# Patient Record
Sex: Female | Born: 1935 | Race: White | Hispanic: No | Smoking: Never smoker
Health system: Southern US, Community
[De-identification: ages and names within clinical notes are randomized; demographics above are authoritative.]

## PROBLEM LIST (undated history)

## (undated) DIAGNOSIS — J349 Unspecified disorder of nose and nasal sinuses: Secondary | ICD-10-CM

## (undated) DIAGNOSIS — I10 Essential (primary) hypertension: Secondary | ICD-10-CM

## (undated) DIAGNOSIS — E785 Hyperlipidemia, unspecified: Secondary | ICD-10-CM

## (undated) DIAGNOSIS — K219 Gastro-esophageal reflux disease without esophagitis: Secondary | ICD-10-CM

## (undated) HISTORY — DX: Essential (primary) hypertension: I10

## (undated) HISTORY — PX: TUBAL LIGATION: SHX77

## (undated) HISTORY — DX: Hyperlipidemia, unspecified: E78.5

## (undated) HISTORY — DX: Unspecified disorder of nose and nasal sinuses: J34.9

## (undated) HISTORY — DX: Gastro-esophageal reflux disease without esophagitis: K21.9

## (undated) HISTORY — PX: ABDOMINAL HYSTERECTOMY: SUR658

## (undated) HISTORY — PX: APPENDECTOMY: SHX54

---

## 1998-12-18 ENCOUNTER — Ambulatory Visit (HOSPITAL_COMMUNITY): Admission: RE | Admit: 1998-12-18 | Discharge: 1998-12-18 | Payer: Self-pay | Admitting: *Deleted

## 1999-02-02 ENCOUNTER — Other Ambulatory Visit: Admission: RE | Admit: 1999-02-02 | Discharge: 1999-02-02 | Payer: Self-pay | Admitting: Obstetrics and Gynecology

## 1999-05-27 ENCOUNTER — Encounter: Admission: RE | Admit: 1999-05-27 | Discharge: 1999-05-27 | Payer: Self-pay | Admitting: Family Medicine

## 2000-01-18 ENCOUNTER — Other Ambulatory Visit: Admission: RE | Admit: 2000-01-18 | Discharge: 2000-01-18 | Payer: Self-pay | Admitting: Obstetrics and Gynecology

## 2001-01-17 ENCOUNTER — Other Ambulatory Visit: Admission: RE | Admit: 2001-01-17 | Discharge: 2001-01-17 | Payer: Self-pay | Admitting: Obstetrics and Gynecology

## 2005-11-03 ENCOUNTER — Encounter: Admission: RE | Admit: 2005-11-03 | Discharge: 2005-11-03 | Payer: Self-pay | Admitting: Family Medicine

## 2007-01-19 ENCOUNTER — Encounter: Admission: RE | Admit: 2007-01-19 | Discharge: 2007-01-19 | Payer: Self-pay | Admitting: Gastroenterology

## 2007-09-25 ENCOUNTER — Encounter: Admission: RE | Admit: 2007-09-25 | Discharge: 2007-09-25 | Payer: Self-pay | Admitting: Orthopedic Surgery

## 2007-09-27 ENCOUNTER — Ambulatory Visit (HOSPITAL_BASED_OUTPATIENT_CLINIC_OR_DEPARTMENT_OTHER): Admission: RE | Admit: 2007-09-27 | Discharge: 2007-09-27 | Payer: Self-pay | Admitting: Orthopedic Surgery

## 2010-12-28 NOTE — Op Note (Signed)
NAMEBailley, Kristi Huerta                ACCOUNT NO.:  [id number]   MEDICAL RECORD NO.:  [id number]          PATIENT TYPE:  AMB   LOCATION:  DSC                          FACILITY:  [facility]   PHYSICIAN:  Katy Fitch. Sypher, M.D. 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   POSTOPERATIVE DIAGNOSIS:  Mucoid cyst, right ring finger ulnar nail  fold, status post failed debridement by dermatologist, with 4+  degenerative arthritis of distal interphalangeal joint.   OPERATION:  Distal interphalangeal joint debridement with removal of  marginal osteophytes and irrigation of cartilaginous debris and  resection of mucoid cyst, right ring finger.   OPERATING SURGEON:  Katy Fitch. Sypher, M.D.   ASSISTANT:  Jonni Sanger, P.A.   ANESTHESIA:  2% lidocaine metacarpal head-level block, right ring  finger, supplemented by IV sedation, supervising anesthesiologist  Maren Beach, M.D.   INDICATIONS:  Kristi Huerta is a 77 year old woman referred for evaluation  and management of a recurrent mucoid cyst from the dorsal ulnar aspect  of her right ring finger.  She had seen her dermatologist and had a  debridement of the finger approximately 6 months prior.  She had  immediate recurrence.  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   Due to a failure to respond to nonoperative measures and due to the fact  that her cyst was thinning the skin to the point of rupture, she  requested debridement.  Preoperatively she was advised that we could not  affect the natural history of her osteoarthritis and could not guarantee  that she would not develop further mucoid cysts in this finger or  adjacent fingers.   Despite this, in Kristi effort to prevent rupture and  possible septic arthritis of the DIP joint, she is brought to the  operating room at this time for debridement and cyst excision.   PROCEDURE:  Kristi Huerta was brought to the operating room and placed in  supine position on the operating table.   Following light sedation, the right arm was prepped with Betadine soap  and solution and sterilely draped.  Lidocaine 2% was infiltrated at the  metacarpal head level to obtain the initial block.  Following  exsanguination of the right ring finger with a gauze wrap, a strip of  Esmarch bandage was used as a digital tourniquet over the proximal  phalangeal segment.  The procedure commenced with longitudinal incisions  paralleling the extensor tendon on its dorsal ulnar and dorsal radial  aspect.  The capsule of the DIP joint was entered on the dorsal radial  and dorsal ulnar aspects, sparing the collateral ligaments.  Marginal  osteophytes were removed from the base of the distal phalanx and  synovium removed over the dorsal aspect of the middle phalangeal head.  Extensive mucinous material was removed from the DIP joint, followed by  thorough irrigation with sterile saline and a blunt dental needle.  The  mucoid cyst was then debrided with removal of the pseudomembrane.   The wound was then repaired with interrupted sutures of 5-0 nylon.   For aftercare Kristi Huerta was provided a prescription for Vicodin 5 mg  one p.o. q.4-6 h. p.r.n. pain, 20 tablets without refill.  Also Keflex  500 mg one p.o. q.8 h. x4 days as a prophylactic antibiotic.      Katy Fitch Sypher, M.D.  Electronically Signed     RVS/MEDQ  D:  09/27/2007  T:  09/28/2007  Job:  [id number]

## 2011-05-06 LAB — HEPATIC FUNCTION PANEL
ALT: 24
Bilirubin, Direct: 0.1
Indirect Bilirubin: 0.4
Total Protein: 6.4

## 2011-05-06 LAB — BASIC METABOLIC PANEL
BUN: 12
Calcium: 9.6
Chloride: 104
GFR calc non Af Amer: >60
Glucose, Bld: 97

## 2011-05-06 LAB — POCT HEMOGLOBIN-HEMACUE: Hemoglobin: 14.1

## 2012-10-01 ENCOUNTER — Ambulatory Visit (INDEPENDENT_AMBULATORY_CARE_PROVIDER_SITE_OTHER): Payer: Medicare Other | Admitting: Internal Medicine

## 2012-10-01 ENCOUNTER — Encounter: Payer: Self-pay | Admitting: Internal Medicine

## 2012-10-01 VITALS — BP 120/68 | HR 65 | Temp 98.4°F | Ht 65.0 in | Wt 129.6 lb

## 2012-10-01 DIAGNOSIS — R05 Cough: Secondary | ICD-10-CM | POA: Insufficient documentation

## 2012-10-01 DIAGNOSIS — R058 Other specified cough: Secondary | ICD-10-CM | POA: Insufficient documentation

## 2012-10-01 MED ORDER — PREDNISONE (PAK) 10 MG PO TABS: ORAL_TABLET | ORAL | Status: DC

## 2012-10-01 MED ORDER — FAMOTIDINE 20 MG PO TABS: ORAL_TABLET | ORAL | Status: DC

## 2012-10-01 MED ORDER — TRAMADOL HCL 50 MG PO TABS: ORAL_TABLET | ORAL | Status: DC

## 2012-10-01 MED ORDER — PANTOPRAZOLE SODIUM 40 MG PO TBEC: 40.0000 mg | DELAYED_RELEASE_TABLET | Freq: Every day | ORAL | Status: DC

## 2012-10-01 NOTE — Assessment & Plan Note (Addendum)
The most common causes of chronic cough in immunocompetent adults include the following: upper airway cough syndrome (UACS), previously referred to as postnasal drip syndrome (PNDS), which is caused by variety of rhinosinus conditions; (2) asthma; (3) GERD; (4) chronic bronchitis from cigarette smoking or other inhaled environmental irritants; (5) nonasthmatic eosinophilic bronchitis; and (6) bronchiectasis.   These conditions, singly or in combination, have accounted for up to 94% of the causes of chronic cough in prospective studies.   Other conditions have constituted no >6% of the causes in prospective studies These have included bronchogenic carcinoma, chronic interstitial pneumonia, sarcoidosis, left ventricular failure, ACEI-induced cough, and aspiration from a condition associated with pharyngeal dysfunction.    Chronic cough is often simultaneously caused by more than one condition. A single cause has been found from 38 to 82% of the time, multiple causes from 18 to 62%. Multiply caused cough has been the result of three diseases up to 42% of the time.       Most likely this is  Classic Upper airway cough syndrome, so named because it's frequently impossible to sort out how much is  CR/sinusitis with freq throat clearing (which can be related to primary GERD)   vs  causing  secondary (" extra esophageal")  GERD from wide swings in gastric pressure that occur with throat clearing, often  promoting self use of mint and menthol lozenges that reduce the lower esophageal sphincter tone and exacerbate the problem further in a cyclical fashion.   These are the same pts (now being labeled as having "irritable larynx syndrome" by some cough centers) who not infrequently have a history of having failed to tolerate ace inhibitors,  dry powder inhalers or biphosphonates or report having atypical reflux symptoms that don't respond to standard doses of PPI , and are easily confused as having aecopd or asthma  flares by even experienced allergists/ pulmonologists.   For now max rx directed at gerd/cyclical cough then regroup  See instructions for specific recommendations which were reviewed directly with the patient who was given a copy with highlighter outlining the key components.

## 2012-10-01 NOTE — Progress Notes (Signed)
Subjective:    Patient ID: Kristi Huerta, female    DOB: [birth year]  MRN: [id number]  HPI  8 yowf never smoker with h/o allergies s asthma in 20's but outgrew it in her 59's symptoms in spring never cough or wheeze then onset cough April 2013 referred to pulmonary clinic 10/01/2012 by Dr Philipp Deputy   10/01/2012 1st pulmonary eval cc daily cough abupt onset April 2013 and persistent since [facility] with worse nasal symptoms intermittently but always the sensation of draingage even when no active nasal symptoms> never produces any mucus. Treated with clariton,  mucinex dm,  omeprazole, no change. Only better with hydrocodan then sleeps all night then recurs p stirring around for an hour daily.  No obvious daytime variabilty or assoc sob cp or chest tightness, subjective wheeze overt s  hb symptoms. No unusual exp hx    Sleeping ok without nocturnal  or early am exacerbation  of respiratory  c/o's or need for noct saba. Also denies any obvious fluctuation of symptoms with weather or environmental changes or other aggravating or alleviating factors except as outlined above      Kouffman Reflux v Neurogenic Cough Differentiator Reflux Comments  Do you awaken from a sound sleep coughing violently?                            With trouble breathing? no   Do you have choking episodes when you cannot  Get enough air, gasping for air ?              rarely   Do you usually cough when you lie down into  The bed, or when you just lie down to rest ?                          Yes   Do you usually cough after meals or eating?         Yes   Do you cough when (or after) you bend over?    no   GERD SCORE     Kouffman Reflux v Neurogenic Cough Differentiator Neurogenic   Do you more-or-less cough all day long? yes   Does change of temperature make you cough? no   Does laughing or chuckling cause you to cough? sometimes   Do fumes (perfume, automobile fumes, burned  Toast, etc.,) cause you to cough  ?      no   Does speaking, singing, or talking on the phone cause you to cough   ?               Sometimes    Neurogenic/Airway score       Review of Systems  Constitutional: Positive for unexpected weight change. Negative for fever.  HENT: Positive for sneezing. Negative for ear pain, nosebleeds, congestion, sore throat, rhinorrhea, trouble swallowing, dental problem, postnasal drip and sinus pressure.   Eyes: Negative for redness and itching.  Respiratory: Positive for cough. Negative for chest tightness, shortness of breath and wheezing.   Cardiovascular: Negative for palpitations and leg swelling.  Gastrointestinal: Negative for nausea and vomiting.  Genitourinary: Negative for dysuria.  Musculoskeletal: Negative for joint swelling.  Skin: Negative for rash.  Neurological: Negative for headaches.  Hematological: Does not bruise/bleed easily.  Psychiatric/Behavioral: Positive for dysphoric mood. The patient is not nervous/anxious.        Objective:   Physical Exam  Pleasant thin amb wf nad with frequent throat clearing Wt Readings from Last 3 Encounters:  10/01/12 129 lb 9.6 oz (58.786 kg)     HEENT: nl dentition, turbinates, and orophanx. Nl external ear canals without cough reflex   NECK :  without JVD/Nodes/TM/ nl carotid upstrokes bilaterally   LUNGS: no acc muscle use, clear to A and P bilaterally without cough on insp or exp maneuvers   CV:  RRR  no s3 or murmur or increase in P2, no edema   ABD:  soft and nontender with nl excursion in the supine position. No bruits or organomegaly, bowel sounds nl  MS:  warm without deformities, calf tenderness, cyanosis or clubbing  SKIN: warm and dry without lesions    NEURO:  alert, approp, no deficits        Assessment & Plan:

## 2012-10-01 NOTE — Patient Instructions (Signed)
The key to effective treatment for your cough is eliminating the non-stop cycle of cough you're stuck in long enough to let your airway heal completely and then see if there is anything still making you cough once you stop the cough suppression, but this should take no more than 5 days to figuure out  First take delsym two tsp every 12 hours and supplement if needed with  tramadol 50 mg up to 2 every 4 hours to suppress the urge to cough. Swallowing water or using ice chips/non mint and menthol containing candies (such as lifesavers or sugarless jolly ranchers) are also effective.  You should rest your voice and avoid activities that you know make you cough.  Once you have eliminated the cough for 3 straight days try reducing the tramadol first,  then the delsym as tolerated.    Try protonix 40 mg     Take 30-60 min before first meal of the day and Pepcid 20 mg one bedtime until  Return  GERD (REFLUX)  is an extremely common cause of respiratory symptoms, many times with no significant heartburn at all.    It can be treated with medication, but also with lifestyle changes including avoidance of late meals, excessive alcohol, smoking cessation, and avoid fatty foods, chocolate, peppermint, colas, red wine, and acidic juices such as orange juice.  NO MINT OR MENTHOL PRODUCTS SO NO COUGH DROPS  USE SUGARLESS CANDY INSTEAD (jolley ranchers or Stover's)  NO OIL BASED VITAMINS - use powdered substitutes.    Prednisone 10 mg take  4 each am x 2 days,   2 each am x 2 days,  1 each am x2days and stop    Please schedule a follow up office visit in 4 weeks, sooner if needed

## 2012-10-29 ENCOUNTER — Ambulatory Visit (INDEPENDENT_AMBULATORY_CARE_PROVIDER_SITE_OTHER): Payer: Medicare Other | Admitting: Internal Medicine

## 2012-10-29 ENCOUNTER — Other Ambulatory Visit (INDEPENDENT_AMBULATORY_CARE_PROVIDER_SITE_OTHER): Payer: Medicare Other

## 2012-10-29 ENCOUNTER — Encounter: Payer: Self-pay | Admitting: Internal Medicine

## 2012-10-29 VITALS — BP 140/82 | HR 63 | Temp 97.8°F | Ht 65.0 in | Wt 130.0 lb

## 2012-10-29 DIAGNOSIS — R05 Cough: Secondary | ICD-10-CM

## 2012-10-29 DIAGNOSIS — R059 Cough, unspecified: Secondary | ICD-10-CM

## 2012-10-29 LAB — CBC WITH DIFFERENTIAL/PLATELET
Basophils Absolute: 0 10*3/uL (ref 0.0–0.1)
Lymphocytes Relative: 25.6 % (ref 12.0–46.0)
Lymphs Abs: 1.1 10*3/uL (ref 0.7–4.0)
Monocytes Absolute: 0.5 10*3/uL (ref 0.1–1.0)
Monocytes Relative: 11 % (ref 3.0–12.0)
Neutro Abs: 2.6 10*3/uL (ref 1.4–7.7)
Neutrophils Relative %: 59.2 % (ref 43.0–77.0)
Platelets: 237 10*3/uL (ref 150.0–400.0)
RBC: 4.07 Mil/uL (ref 3.87–5.11)
RDW: 13 % (ref 11.5–14.6)
WBC: 4.3 10*3/uL — ABNORMAL LOW (ref 4.5–10.5)

## 2012-10-29 LAB — ALLERGY PROFILE REGION II-DC, DE, MD, ~~LOC~~, VA
Aspergillus fumigatus, m3: <0.1 kU/L
Box Elder IgE: <0.1 kU/L
Cat Dander: <0.1 kU/L
Common Ragweed: 0.21 kU/L — ABNORMAL HIGH
Dog Dander: <0.1 kU/L
Lamb's Quarters: 0.11 kU/L — ABNORMAL HIGH
Meadow Grass: 0.95 kU/L — ABNORMAL HIGH
Oak: <0.1 kU/L

## 2012-10-29 MED ORDER — TRAMADOL HCL 50 MG PO TABS: ORAL_TABLET | ORAL | Status: DC

## 2012-10-29 MED ORDER — PREDNISONE (PAK) 10 MG PO TABS: ORAL_TABLET | ORAL | Status: DC

## 2012-10-29 NOTE — Patient Instructions (Addendum)
Prednisone 10 mg take  4 each am x 2 days,   2 each am x 2 days,  1 each am x2days and stop   Take delsym two tsp every 12 hours and supplement if needed with  tramadol 50 mg up to 2 every 4 hours to suppress the urge to cough or even to clear your throat. Swallowing water or using ice chips/non mint and menthol containing candies (such as lifesavers or sugarless jolly ranchers) are also effective.  You should rest your voice and avoid activities that you know make you cough.  Once you have eliminated the cough for 3 straight days try reducing the tramadol first,  then the delsym as tolerated.    Chlortrimeton 4 mg at bedtime until return  Please see patient coordinator before you leave today  to schedule sinus ct  Please remember to go to the lab  department downstairs for your tests - we will call you with the results when they are available.  Please schedule a follow up office visit in 4 weeks, sooner if needed

## 2012-10-29 NOTE — Assessment & Plan Note (Signed)
-   sinus CT 10/29/2012  >> - Allergy Profile 10/29/2012 >>  ? Prednisone response is intriguing and favors cough variant asthma or eos rhinitis/ bronchitis but still strongly feel this is  Classic Upper airway cough syndrome, so named because it's frequently impossible to sort out how much is  CR/sinusitis with freq throat clearing (which can be related to primary GERD)   vs  causing  secondary (" extra esophageal")  GERD from wide swings in gastric pressure that occur with throat clearing, often  promoting self use of mint and menthol lozenges that reduce the lower esophageal sphincter tone and exacerbate the problem further in a cyclical fashion.   These are the same pts (now being labeled as having "irritable larynx syndrome" by some cough centers) who not infrequently have a history of having failed to tolerate ace inhibitors,  dry powder inhalers or biphosphonates or report having atypical reflux symptoms that don't respond to standard doses of PPI , and are easily confused as having aecopd or asthma flares by even experienced allergists/ pulmonologists.  Add 1st get h1 at hs  Explained to pt  The standardized cough guidelines published in Chest by [place] in 2006 are still the best available and consist of a multiple step process (up to 12!) , not a single office visit,  and are intended  to address this problem logically,  with an alogrithm dependent on response to empiric treatment at  each progressive step  to determine a specific diagnosis with  minimal addtional testing needed. Therefore if adherence is an issue or can't be accurately verified,  it's very unlikely the standard evaluation and treatment will be successful here.    Furthermore, response to therapy (other than acute cough suppression, which should only be used short term with avoidance of narcotic containing cough syrups if possible), can be a gradual process for which the patient may perceive immediate benefit.  Unlike  going to an eye doctor where the best perscription is almost always the first one and is immediately effective, this is almost never the case in the management of chronic cough syndromes. Therefore the patient needs to commit up front to consistently adhere to recommendations  for up to 6 weeks of therapy directed at the likely underlying problem(s) before the response can be reasonably evaluated.

## 2012-10-29 NOTE — Progress Notes (Signed)
Subjective:    Patient ID: Kristi Huerta, female    DOB: 17-Sep-1933  MRN: [id number]  HPI  65 yowf never smoker with h/o allergies s asthma in 20's but outgrew it in her 23's symptoms in spring never cough or wheeze then onset cough April 2013 referred to pulmonary clinic 10/01/2012 by Dr Philipp Deputy   10/01/2012 1st pulmonary eval cc daily cough abupt onset April 2013 and persistent since [facility] with worse nasal symptoms intermittently but always the sensation of draingage even when no active nasal symptoms> never produces any mucus. Treated with clariton,  mucinex dm,  omeprazole, no change. Only better with hydrocodan then sleeps all night then recurs p stirring around for an hour daily.  rec First take delsym two tsp every 12 hours and supplement if needed with  tramadol 50 mg up to 2 every 4 hours to suppress the urge to cough.   Try protonix 40 mg Take 30-60 min before first meal of the day and Pepcid 20 mg one bedtime until  Return GERD  Diet  Prednisone 10 mg take  4 each am x 2 days,   2 each am x 2 days,  1 each am x2days and stop    10/29/2012 f/u ov/Hieu Herms cc transiently better on pred/tramadol and since stopped > cough   worse around 3 am > no mucus, still urge to clear the throat during the day  No obvious daytime variabilty or assoc sob cp or chest tightness, subjective wheeze overt s  hb symptoms. No unusual exp hx    Sleeping ok without nocturnal  or early am exacerbation  of respiratory  c/o's or need for noct saba. Also denies any obvious fluctuation of symptoms with weather or environmental changes or other aggravating or alleviating factors except as outlined above      Kouffman Reflux v Neurogenic Cough Differentiator Reflux Comments  Do you awaken from a sound sleep coughing violently?                            With trouble breathing? Now yes   Do you have choking episodes when you cannot  Get enough air, gasping for air ?              Now no   Do you  usually cough when you lie down into  The bed, or when you just lie down to rest ?                          Not now   Do you usually cough after meals or eating?         Not now   Do you cough when (or after) you bend over?    no   GERD SCORE     Kouffman Reflux v Neurogenic Cough Differentiator Neurogenic   Do you more-or-less cough all day long? Throat clear   Does change of temperature make you cough? no   Does laughing or chuckling cause you to cough? sometimes   Do fumes (perfume, automobile fumes, burned  Toast, etc.,) cause you to cough ?      no   Does speaking, singing, or talking on the phone cause you to cough   ?               Sometimes    Neurogenic/Airway score  Objective:   Physical Exam  Pleasant thin amb wf nad with occ  throat clearing 10/29/12   Wt 130  Wt Readings from Last 3 Encounters:  10/01/12 129 lb 9.6 oz (58.786 kg)     HEENT: nl dentition, turbinates, and orophanx. Nl external ear canals without cough reflex   NECK :  without JVD/Nodes/TM/ nl carotid upstrokes bilaterally   LUNGS: no acc muscle use, clear to A and P bilaterally without cough on insp or exp maneuvers   CV:  RRR  no s3 or murmur or increase in P2, no edema   ABD:  soft and nontender with nl excursion in the supine position. No bruits or organomegaly, bowel sounds nl  MS:  warm without deformities, calf tenderness, cyanosis or clubbing  SKIN: warm and dry without lesions    NEURO:  alert, approp, no deficits    CXR 08/30/12 nl     Assessment & Plan:

## 2012-10-30 ENCOUNTER — Telehealth: Payer: Self-pay | Admitting: Internal Medicine

## 2012-10-30 NOTE — Telephone Encounter (Signed)
Notes Recorded by Nyoka Cowden, MD on 10/29/2012 at 5:00 PM Call patient : Studies are unremarkable - minimally reactive to grass and ragweed but neither correlate with her ongoing symptoms, esp this time of year -- I spoke with patient about results and she verbalized understanding and had no questions

## 2012-11-02 ENCOUNTER — Ambulatory Visit (INDEPENDENT_AMBULATORY_CARE_PROVIDER_SITE_OTHER)
Admission: RE | Admit: 2012-11-02 | Discharge: 2012-11-02 | Disposition: A | Discharge status: Home / Self Care | Payer: Medicare Other | Source: Ambulatory Visit | Attending: Internal Medicine | Admitting: Internal Medicine

## 2012-11-02 DIAGNOSIS — R05 Cough: Secondary | ICD-10-CM

## 2012-11-05 ENCOUNTER — Telehealth: Payer: Self-pay | Admitting: Internal Medicine

## 2012-11-05 ENCOUNTER — Encounter: Payer: Self-pay | Admitting: Internal Medicine

## 2012-11-05 NOTE — Progress Notes (Signed)
Quick Note:  Spoke with patient, made her aware of results as listed below per Dr. Sherene Sires. Patient verbalized understanding and nothing further needed at this time. ______

## 2012-11-05 NOTE — Telephone Encounter (Signed)
Spoke with patient, made her aware of results as listed below per Dr. Sherene Sires. Patient verbalized understanding and nothing further needed at this time.  Result Note    Call patient : Study is unremarkable, no change in recs

## 2012-11-26 ENCOUNTER — Encounter: Payer: Self-pay | Admitting: Internal Medicine

## 2012-11-26 ENCOUNTER — Ambulatory Visit (INDEPENDENT_AMBULATORY_CARE_PROVIDER_SITE_OTHER): Payer: Medicare Other | Admitting: Internal Medicine

## 2012-11-26 VITALS — BP 142/80 | HR 88 | Temp 97.6°F | Ht 65.0 in | Wt 127.2 lb

## 2012-11-26 DIAGNOSIS — R05 Cough: Secondary | ICD-10-CM

## 2012-11-26 NOTE — Assessment & Plan Note (Signed)
-   sinus CT 11/02/12 > neg - Allergy Profile 10/29/2012 >> IgE  12 pos grass/  Ragweed  Still all signs point to  Classic Upper airway cough syndrome, so named because it's frequently impossible to sort out how much is  CR/sinusitis with freq throat clearing (which can be related to primary GERD)   vs  causing  secondary (" extra esophageal")  GERD from wide swings in gastric pressure that occur with throat clearing, often  promoting self use of mint and menthol lozenges that reduce the lower esophageal sphincter tone and exacerbate the problem further in a cyclical fashion.   These are the same pts (now being labeled as having "irritable larynx syndrome" by some cough centers) who not infrequently have a history of having failed to tolerate ace inhibitors,  dry powder inhalers or biphosphonates or report having atypical reflux symptoms that don't respond to standard doses of PPI , and are easily confused as having aecopd or asthma flares by even experienced allergists/ pulmonologists.   For now continue rx for gerd and use 1st gen h1 or zyrtec prn and see how she does during her peak grass season and if not maintaining good control add singulair as a maint rx    Each maintenance medication was reviewed in detail including most importantly the difference between maintenance and as needed and under what circumstances the prns are to be used.  Please see instructions for details which were reviewed in writing and the patient given a copy.

## 2012-11-26 NOTE — Progress Notes (Signed)
Subjective:    Patient ID: Kristi Huerta, female    DOB: 12/20/1933  MRN: [id number]  HPI  63 yowf never smoker with h/o allergies s asthma in 20's but outgrew it in her 60's symptoms in spring never cough or wheeze then onset cough April 2013 referred to pulmonary clinic 10/01/2012 by Dr Philipp Deputy   10/01/2012 1st pulmonary eval cc daily cough abupt onset April 2013 and persistent since then [facility] with worse nasal symptoms intermittently but always the sensation of draingage even when no active nasal symptoms> never produces any mucus. Treated with clariton,  mucinex dm,  omeprazole, no change. Only better with hydrocodan then sleeps all night then recurs p stirring around for an hour daily.  rec First take delsym two tsp every 12 hours and supplement if needed with  tramadol 50 mg up to 2 every 4 hours to suppress the urge to cough.   Try protonix 40 mg Take 30-60 min before first meal of the day and Pepcid 20 mg one bedtime until  Return GERD  Diet  Prednisone 10 mg take  4 each am x 2 days,   2 each am x 2 days,  1 each am x2days and stop    10/29/2012 f/u ov/Wert cc transiently better on pred/tramadol and since stopped > cough   worse around 3 am > no mucus, still urge to clear the throat during the day Prednisone 10 mg take  4 each am x 2 days,   2 each am x 2 days,  1 each am x2days and stop  Take delsym two tsp every 12 hours and supplement if needed with  tramadol 50 mg up to 2 every 4 hours to suppress the urge to cough or even to clear your throat. Swallowing water or using ice chips/non mint and menthol containing candies (such as lifesavers or sugarless jolly ranchers) are also effective.  You should rest your voice and avoid activities that you know make you cough. Once you have eliminated the cough for 3 straight days try reducing the tramadol first,  then the delsym as tolerated.   Chlortrimeton 4 mg at bedtime until return  11/26/2012 f/u ov/Wert f/u chronic cough Chief  Complaint  Patient presents with  . Follow-up    Cough is much improved since last visit, only usually coughs in the am , non prod.    rare delsym, Cough only once significantly 1 day prior to [place]  [place]  No obvious daytime variabilty or assoc sob cp or chest tightness, subjective wheeze overt s  hb symptoms. No unusual exp hx    Sleeping ok without nocturnal  or early am exacerbation  of respiratory  c/o's or need for noct saba. Also denies any obvious fluctuation of symptoms with weather or environmental changes or other aggravating or alleviating factors except as outlined above      Kouffman Reflux v Neurogenic Cough Differentiator 11/26/12 Comments  Do you awaken from a sound sleep coughing violently?                            With trouble breathing? Not now   Do you have choking episodes when you cannot  Get enough air, gasping for air ?              Not now   Do you usually cough when you lie down into  The bed, or when you just  lie down to rest ?                          Not now   Do you usually cough after meals or eating?         Not now   Do you cough when (or after) you bend over?    no   GERD SCORE     Kouffman Reflux v Neurogenic Cough Differentiator Neurogenic   Do you more-or-less cough all day long? Not now   Does change of temperature make you cough? no   Does laughing or chuckling cause you to cough? no   Do fumes (perfume, automobile fumes, burned  Toast, etc.,) cause you to cough ?      no   Does speaking, singing, or talking on the phone cause you to cough   ?               sometimes   Neurogenic/Airway score               Objective:   Physical Exam  Pleasant thin amb wf nad no longer with    throat clearing  Wt Readings from Last 3 Encounters:  11/26/12 127 lb 3.2 oz (57.698 kg)  10/29/12 130 lb (58.968 kg)  10/01/12 129 lb 9.6 oz (58.786 kg)    HEENT: nl dentition, turbinates, and orophanx. Nl external ear canals without cough reflex   NECK :   without JVD/Nodes/TM/ nl carotid upstrokes bilaterally   LUNGS: no acc muscle use, clear to A and P bilaterally without cough on insp or exp maneuvers   CV:  RRR  no s3 or murmur or increase in P2, no edema   ABD:  soft and nontender with nl excursion in the supine position. No bruits or organomegaly, bowel sounds nl  MS:  warm without deformities, calf tenderness, cyanosis or clubbing  SKIN: warm and dry without lesions    NEURO:  alert, approp, no deficits    CXR 08/30/12 nl     Assessment & Plan:

## 2012-11-26 NOTE — Patient Instructions (Addendum)
First try chlortrimeton during the day to see if it helps the tickle, if so I would rec add singulair as a daily medication   Please schedule a follow up office visit in 6 weeks, call sooner if needed

## 2012-12-20 ENCOUNTER — Telehealth: Payer: Self-pay | Admitting: Internal Medicine

## 2012-12-20 MED ORDER — MONTELUKAST SODIUM 10 MG PO TABS: 10.0000 mg | ORAL_TABLET | Freq: Every day | ORAL | Status: DC

## 2012-12-20 NOTE — Telephone Encounter (Signed)
Returning call can be reached at [phone].Kristi Huerta

## 2012-12-20 NOTE — Telephone Encounter (Signed)
Last OV 11/26/13 with MW Per Patient instructions: First try chlortrimeton during the day to see if it helps the tickle, if so I would rec add singulair as a daily medication  Please schedule a follow up office visit in 6 weeks, call sooner if needed   LMOM x 1

## 2012-12-20 NOTE — Telephone Encounter (Signed)
Pt is aware that MW recommended her using Singulair as a daily medication if chlortrimeton helped with her tickle. She states it did help with the tickle. I have sent in the rx for Singulair. She has a pending OV on 01/04/13.

## 2013-01-04 ENCOUNTER — Encounter: Payer: Self-pay | Admitting: Internal Medicine

## 2013-01-04 ENCOUNTER — Ambulatory Visit (INDEPENDENT_AMBULATORY_CARE_PROVIDER_SITE_OTHER): Payer: Medicare Other | Admitting: Internal Medicine

## 2013-01-04 VITALS — BP 140/80 | HR 64 | Temp 98.7°F | Ht 65.0 in | Wt 129.6 lb

## 2013-01-04 DIAGNOSIS — R05 Cough: Secondary | ICD-10-CM

## 2013-01-04 NOTE — Progress Notes (Signed)
Subjective:    Patient ID: Kristi Huerta, female    DOB: Jul 21, 1934  MRN: [id number]    Brief patient profile:  88 yowf never smoker with h/o allergies s asthma in 20's but outgrew it in her 15's symptoms in spring never cough or wheeze then onset cough April 2013 referred to pulmonary clinic 10/01/2012 by Dr Philipp Deputy   10/01/2012 1st pulmonary eval cc daily cough abupt onset April 2013 and persistent since then [facility] with worse nasal symptoms intermittently but always the sensation of draingage even when no active nasal symptoms> never produces any mucus. Treated with clariton,  mucinex dm,  omeprazole, no change. Only better with hydrocodan then sleeps all night then recurs p stirring around for an hour daily.  rec First take delsym two tsp every 12 hours and supplement if needed with  tramadol 50 mg up to 2 every 4 hours to suppress the urge to cough.   Try protonix 40 mg Take 30-60 min before first meal of the day and Pepcid 20 mg one bedtime until  Return GERD  Diet  Prednisone 10 mg take  4 each am x 2 days,   2 each am x 2 days,  1 each am x2days and stop    10/29/2012 f/u ov/Kristi Huerta cc transiently better on pred/tramadol and since stopped > cough   worse around 3 am > no mucus, still urge to clear the throat during the day Prednisone 10 mg take  4 each am x 2 days,   2 each am x 2 days,  1 each am x2days and stop  Take delsym two tsp every 12 hours and supplement if needed with  tramadol 50 mg up to 2 every 4 hours to suppress the urge to cough or even to clear your throat. Swallowing water or using ice chips/non mint and menthol containing candies (such as lifesavers or sugarless jolly ranchers) are also effective.  You should rest your voice and avoid activities that you know make you cough. Once you have eliminated the cough for 3 straight days try reducing the tramadol first,  then the delsym as tolerated.   Chlortrimeton 4 mg at bedtime until return  11/26/2012 f/u ov/Kristi Huerta f/u  chronic cough Chief Complaint  Patient presents with  . Follow-up    Cough is much improved since last visit, only usually coughs in the am , non prod.    rare delsym, Cough only once significantly 1 day prior to OV  [place] rec First try chlortrimeton during the day to see if it helps the tickle, if so I would rec add singulair as a daily medication   01/04/2013 f/u ov/Kristi Huerta re cough worse on singulair and off chlortrimeton and acid supression ( did not follow inst) Chief Complaint  Patient presents with  . Follow-up    Pt states that cough is some less since the last visit. Mainly the cough is present at night and early am. Cough is non prod. She c/o sneezing and itchy, watery eyes for the past couple of wks.     No obvious daytime variabilty or assoc sob cp or chest tightness, subjective wheeze overt s  hb symptoms. No unusual exp hx    Sleeping ok without nocturnal  or early am exacerbation  of respiratory  c/o's or need for noct saba. Also denies any obvious fluctuation of symptoms with weather or environmental changes or other aggravating or alleviating factors except as outlined above   Current  Medications, Allergies, Past Medical History, Past Surgical History, Family History, and Social History were reviewed in [employer] record.  ROS  The following are not active complaints unless bolded sore throat, dysphagia, dental problems, itching, sneezing,  nasal congestion or excess/ purulent secretions, ear ache,   fever, chills, sweats, unintended wt loss, pleuritic or exertional cp, hemoptysis,  orthopnea pnd or leg swelling, presyncope, palpitations, heartburn, abdominal pain, anorexia, nausea, vomiting, diarrhea  or change in bowel or urinary habits, change in stools or urine, dysuria,hematuria,  rash, arthralgias, visual complaints, headache, numbness weakness or ataxia or problems with walking or coordination,  change in mood/affect or memory.            Objective:   Physical Exam  Pleasant thin amb wf nad no longer with  throat clearing but very heavy perfume present  Wt Readings from Last 3 Encounters:  11/26/12 127 lb 3.2 oz (57.698 kg)  10/29/12 130 lb (58.968 kg)  10/01/12 129 lb 9.6 oz (58.786 kg)    HEENT: nl dentition, turbinates, and orophanx. Nl external ear canals without cough reflex   NECK :  without JVD/Nodes/TM/ nl carotid upstrokes bilaterally   LUNGS: no acc muscle use, clear to A and P bilaterally without cough on insp or exp maneuvers   CV:  RRR  no s3 or murmur or increase in P2, no edema   ABD:  soft and nontender with nl excursion in the supine position. No bruits or organomegaly, bowel sounds nl  MS:  warm without deformities, calf tenderness, cyanosis or clubbing  SKIN: warm and dry without lesions    NEURO:  alert, approp, no deficits    CXR 08/30/12 nl     Assessment & Plan:

## 2013-01-04 NOTE — Patient Instructions (Addendum)
Add back chlortrimeton 4 mg up to every 6 hours as needed and if not effective add back prilosec before bfast  and pepcid 20 mg at bedtime  Stop singulair   Avoid perfumes and heavy scents   If you are satisfied with your treatment plan let your doctor know and he/she can either refill your medications or you can return here when your prescription runs out.     If in any way you are not 100% satisfied,  please tell us.  If 100% better, tell your friends!

## 2013-01-06 NOTE — Assessment & Plan Note (Signed)
-   sinus CT 11/02/12 > neg - Allergy Profile 10/29/2012 >> IgE  12 pos grass/  Ragweed - Failed singulair > stopped 01/04/2013  > restart monotherapy with chlortrimeton  Most c/w  Classic Upper airway cough syndrome, so named because it's frequently impossible to sort out how much is  CR/sinusitis with freq throat clearing (which can be related to primary GERD)   vs  causing  secondary (" extra esophageal")  GERD from wide swings in gastric pressure that occur with throat clearing, often  promoting self use of mint and menthol lozenges that reduce the lower esophageal sphincter tone and exacerbate the problem further in a cyclical fashion.   These are the same pts (now being labeled as having "irritable larynx syndrome" by some cough centers) who not infrequently have a history of having failed to tolerate ace inhibitors,  dry powder inhalers or biphosphonates or report having atypical reflux symptoms that don't respond to standard doses of PPI , and are easily confused as having aecopd or asthma flares by even experienced allergists/ pulmonologists.   Add back 1st get h1 per guidelines.    Each maintenance medication was reviewed in detail including most importantly the difference between maintenance and as needed and under what circumstances the prns are to be used.  Please see instructions for details which were reviewed in writing and the patient given a copy.

## 2016-01-22 ENCOUNTER — Ambulatory Visit (INDEPENDENT_AMBULATORY_CARE_PROVIDER_SITE_OTHER): Payer: Medicare Other | Admitting: Podiatry

## 2016-01-22 ENCOUNTER — Encounter: Payer: Self-pay | Admitting: Podiatry

## 2016-01-22 DIAGNOSIS — L84 Corns and callosities: Secondary | ICD-10-CM | POA: Diagnosis not present

## 2016-01-22 DIAGNOSIS — M205X9 Other deformities of toe(s) (acquired), unspecified foot: Secondary | ICD-10-CM

## 2016-01-22 DIAGNOSIS — R52 Pain, unspecified: Secondary | ICD-10-CM

## 2016-01-22 NOTE — Progress Notes (Signed)
   Subjective:    Patient ID: Kristi Huerta, female    DOB: September 14, 1933, 82 y.o.   MRN: [id number]  HPI  82 year old female presents to the office for concerns of a corn the left fourth toe which is been ongoing for greater than 6 months. She tries to trim the area herself and it comes back. She says the areas painful pressure in shoe gear. Denies any redness or drainage. No other complaints.   Review of Systems  All other systems reviewed and are negative.      Objective:   Physical Exam General: AAO x3, NAD  Dermatological: Hyperkeratotic lesion left lateral fourth digit at the IPJ. Upon debridement no underlying ulceration, drainage or other signs of infection. There is of course plan lesion on the fifth toe. No edema, erythema.  Vascular: Dorsalis Pedis artery and Posterior Tibial artery pedal pulses are 2/4 bilateral with immedate capillary fill time. Pedal hair growth present. There is no pain with calf compression, swelling, warmth, erythema.   Neruologic: Grossly intact via light touch bilateral. Vibratory intact via tuning fork bilateral. Protective threshold with Semmes Wienstein monofilament intact to all pedal sites bilateral.   Musculoskeletal: Adductovarus of the lesser toes. No pain, crepitus, or limitation noted with foot and ankle range of motion bilateral. Muscular strength 5/5 in all groups tested bilateral.  Gait: Unassisted, Nonantalgic.     Assessment & Plan:  82 year old female with hyperkeratotic lesion left fourth toe due to adductovarus the toes. -Treatment options discussed including all alternatives, risks, and complications -Etiology of symptoms were discussed -Lesion debrided without complications or bleeding. Offloading pads were dispensed. Discussed shoe gear modifications well. Follow-up if symptoms continue or worsen.  Celesta Gentile, DPM

## 2017-09-13 ENCOUNTER — Institutional Professional Consult (permissible substitution): Payer: Self-pay | Admitting: Pulmonary Disease

## 2017-09-19 ENCOUNTER — Ambulatory Visit: Payer: Medicare Other | Admitting: Internal Medicine

## 2017-09-19 ENCOUNTER — Encounter: Payer: Self-pay | Admitting: Internal Medicine

## 2017-09-19 VITALS — BP 156/74 | HR 70 | Ht 65.0 in | Wt 132.6 lb

## 2017-09-19 DIAGNOSIS — R05 Cough: Secondary | ICD-10-CM | POA: Diagnosis not present

## 2017-09-19 DIAGNOSIS — R059 Cough, unspecified: Secondary | ICD-10-CM

## 2017-09-19 MED ORDER — TRAMADOL HCL 50 MG PO TABS: ORAL_TABLET | ORAL | 0 refills | Status: DC

## 2017-09-19 MED ORDER — PREDNISONE 10 MG PO TABS: ORAL_TABLET | ORAL | 0 refills | Status: DC

## 2017-09-19 MED ORDER — PANTOPRAZOLE SODIUM 40 MG PO TBEC: 40.0000 mg | DELAYED_RELEASE_TABLET | Freq: Every day | ORAL | 2 refills | Status: DC

## 2017-09-19 NOTE — Patient Instructions (Addendum)
Prednisone 10 mg take  4 each am x 2 days,   2 each am x 2 days,  1 each am x2days and stop   Pantoprazole (protonix) 40 mg   Take  30-60 min before first meal of the day and  Zantac 150 mg  one @  bedtime until return to office - this is the best way to tell whether stomach acid is contributing to your problem.    Take delsym two tsp every 12 hours and supplement if needed with  tramadol 50 mg up to 1 every 4 hours to suppress the urge to cough or even to clear your throat. Swallowing water or using ice chips/non mint and menthol containing candies (such as lifesavers or sugarless jolly ranchers) are also effective.  You should rest your voice and avoid activities that you know make you cough.  Once you have eliminated the cough for 3 straight days try reducing the tramadol first,  then the delsym as tolerated.    For drainage / throat tickle try take CHLORPHENIRAMINE  4 mg - take one every 4 hours as needed - available over the counter- may cause drowsiness so start with just a bedtime dose or two and see how you tolerate it before trying in daytime   Please schedule a follow up office visit in 4 weeks, sooner if needed  with all medications /inhalers/ solutions in hand so we can verify exactly what you are taking. This includes all medications from all doctors and over the counters

## 2017-09-19 NOTE — Progress Notes (Signed)
Subjective:    Patient ID: Kristi Huerta, female    DOB: 05-Nov-1933  MRN: [id number]    Brief patient profile:  55 yowf never smoker with h/o allergies = seasonal rhinitis spring  s asthma in 20's but outgrew it in her 70's  never cough or wheeze then onset cough April 2013 referred to pulmonary clinic 10/01/2012 by Dr Derrill Memo   History of Present Illness  10/01/2012 1st pulmonary eval cc daily cough abupt onset April 2013 and persistent since then esp at [facility] with worse nasal symptoms intermittently but always the sensation of draingage even when no active nasal symptoms> never produces any mucus. Treated with clariton,  mucinex dm,  omeprazole, no change. Only better with hydrocodan then sleeps all night then recurs p stirring around for an hour daily.  rec First take delsym two tsp every 12 hours and supplement if needed with  tramadol 50 mg up to 2 every 4 hours to suppress the urge to cough.   Try protonix 40 mg Take 30-60 min before first meal of the day and Pepcid 20 mg one bedtime until  Return GERD  Diet  Prednisone 10 mg take  4 each am x 2 days,   2 each am x 2 days,  1 each am x2days and stop    10/29/2012 f/u ov/Lynn Sissel cc transiently better on pred/tramadol and since stopped > cough   worse around 3 am > no mucus, still urge to clear the throat during the day Prednisone 10 mg take  4 each am x 2 days,   2 each am x 2 days,  1 each am x2days and stop  Take delsym two tsp every 12 hours and supplement if needed with  tramadol 50 mg up to 2 every 4 hours to suppress the urge to cough or even to clear your throat.     Chlortrimeton 4 mg at bedtime until return    11/26/2012 f/u ov/[name] f/u chronic cough Chief Complaint  Patient presents with  . Follow-up    Cough is much improved since last visit, only usually coughs in the am , non prod.    rare delsym, Cough only once significantly 1 day prior to [place]  [place] rec First try chlortrimeton during the day to see if it helps the  tickle, if so I would rec add singulair as a daily medication    01/04/2013 f/u ov/[name] re cough worse on singulair and off chlortrimeton and acid supression ( did not follow inst) Chief Complaint  Patient presents with  . Follow-up    Pt states that cough is some less since the last visit. Mainly the cough is present at night and early am. Cough is non prod. She c/o sneezing and itchy, watery eyes for the past couple of wks.   rec Add back chlortrimeton 4 mg up to every 6 hours as needed and if not effective add back prilosec before bfast  and pepcid 20 mg at bedtime Stop singulair  Avoid perfumes and heavy scents   09/19/2017  ov/[name] re: re establish re chronic cough - did not add back gerd rx as rec last ov as a contingency Chief Complaint  Patient presents with  . Pulmonary Consult    Referred by Dr. Mayra Neer. Pt has been seen here in the past for cough, last in 2014. She states that her cough never went away and has been worse over the past year. It's esp worse at night and  she has to sleep propped up on 2 pillows. She occ produces some yellow to green sputum that she relates to nasal drainage.   around a year prior to OV  Cough worsened while not on prilosec  / ? Better on  Chlortrimeton bid  Not bothered by sob with daily activities including housekeeping/ vacuuming  Tramadol helps the most Sense of post nasal drainage better now with H1 but only using bid     Kouffman Reflux v Neurogenic Cough Differentiator Reflux Comments  Do you awaken from a sound sleep coughing violently?                            With trouble breathing? Yes but not violent    Do you have choking episodes when you cannot  Get enough air, gasping for air ?              no   Do you usually cough when you lie down into  The bed, or when you just lie down to rest ?                          No    Do you usually cough after meals or eating?         No    Do you cough when (or after) you bend over?     no   GERD SCORE     Kouffman Reflux v Neurogenic Cough Differentiator Neurogenic   Do you more-or-less cough all day long? sporadic   Does change of temperature make you cough? no   Does laughing or chuckling cause you to cough? no   Do fumes (perfume, automobile fumes, burned  Toast, etc.,) cause you to cough ?      maybe   Does speaking, singing, or talking on the phone cause you to cough   ?               Yes    Neurogenic/Airway score         No other  obvious day to day or daytime variability or assoc excess/ purulent sputum or mucus plugs or hemoptysis or cp or chest tightness, subjective wheeze or overt sinus or hb symptoms. No unusual exposure hx or h/o childhood pna/ asthma or knowledge of premature birth.  Sleeping ok flat without nocturnal  or early am exacerbation  of respiratory  c/o's or need for noct saba. Also denies any obvious fluctuation of symptoms with weather or environmental changes or other aggravating or alleviating factors except as outlined above   Current Allergies, Complete Past Medical History, Past Surgical History, Family History, and Social History were reviewed in [employer] record.  ROS  The following are not active complaints unless bolded Hoarseness, sore throat, dysphagia, dental problems, itching, sneezing,  nasal congestion or discharge of excess mucus or purulent secretions, ear ache,   fever, chills, sweats, unintended wt loss or wt gain, classically pleuritic or exertional cp,  orthopnea pnd or leg swelling, presyncope, palpitations, abdominal pain, anorexia, nausea, vomiting, diarrhea  or change in bowel habits or change in bladder habits, change in stools or change in urine, dysuria, hematuria,  rash, arthralgias, visual complaints, headache, numbness, weakness or ataxia or problems with walking or coordination,  change in mood/affect or memory.        Current Meds  Medication Sig  . aspirin 81 MG chewable tablet  Chew 81 mg  by mouth daily.  Marland Kitchen b complex vitamins tablet Take 1 tablet by mouth daily.  . calcium citrate-vitamin D (CITRACAL+D) 315-200 MG-UNIT tablet Take 1 tablet by mouth 2 (two) times daily.  Marland Kitchen escitalopram (LEXAPRO) 10 MG tablet Take 10 mg by mouth daily.  . Garlic 7106 MG CAPS Take 1 capsule by mouth daily.  . hydrochlorothiazide (HYDRODIURIL) 25 MG tablet Take 25 mg by mouth daily.  . pravastatin (PRAVACHOL) 20 MG tablet Take 1 tablet by mouth daily.  . ranitidine (ZANTAC) 150 MG tablet Take 150 mg by mouth as needed for heartburn.  . traMADol (ULTRAM) 50 MG tablet 1-2 every 4 hours as needed for cough or pain  . verapamil (COVERA HS) 180 MG (CO) 24 hr tablet Take 180 mg by mouth at bedtime.  . vitamin C (ASCORBIC ACID) 500 MG tablet Take 500 mg by mouth daily.           Objective:   Physical Exam  amb wf nad   09/19/2017          132   11/26/12 127 lb 3.2 oz (57.698 kg)  10/29/12 130 lb (58.968 kg)  10/01/12 129 lb 9.6 oz (58.786 kg)     Vital signs reviewed - Note on arrival 02 sats  98% on RA   HEENT: nl dentition, turbinates bilaterally, and oropharynx. Nl external ear canals without cough reflex   NECK :  without JVD/Nodes/TM/ nl carotid upstrokes bilaterally   LUNGS: no acc muscle use,  Nl contour chest which is clear to A and P bilaterally without cough on insp or exp maneuvers   CV:  RRR  no s3 or murmur or increase in P2, and no edema   ABD:  soft and nontender with nl inspiratory excursion in the supine position. No bruits or organomegaly appreciated, bowel sounds nl  MS:  Nl gait/ ext warm without deformities, calf tenderness, cyanosis or clubbing No obvious joint restrictions   SKIN: warm and dry without lesions    NEURO:  alert, approp, nl sensorium with  no motor or cerebellar deficits apparent.              Assessment & Plan:

## 2017-09-20 ENCOUNTER — Encounter: Payer: Self-pay | Admitting: Internal Medicine

## 2017-09-20 NOTE — Assessment & Plan Note (Signed)
-   sinus CT 11/02/12 > neg - Allergy Profile 10/29/2012 >> IgE  12 pos grass/  Ragweed - Failed singulair > stopped 01/04/2013  > restart monotherapy with chlortrimeton  Cough never completely resolved now flared x one year and has no specific features except for the association with postnasal drip syndrome sensation that partially responds to Chlor-Trimeton albeit in subtherapeutic doses.  This is most c/w Upper airway cough syndrome (previously labeled PNDS),  is so named because it's frequently impossible to sort out how much is  CR/sinusitis with freq throat clearing (which can be related to primary GERD)   vs  causing  secondary (" extra esophageal")  GERD from wide swings in gastric pressure that occur with throat clearing, often  promoting self use of mint and menthol lozenges that reduce the lower esophageal sphincter tone and exacerbate the problem further in a cyclical fashion.   These are the same pts (now being labeled as having "irritable larynx syndrome" by some cough centers) who not infrequently have a history of having failed to tolerate ace inhibitors,  dry powder inhalers or biphosphonates or report having atypical/extraesophageal reflux symptoms that don't respond to standard doses of PPI  and are easily confused as having aecopd or asthma flares by even experienced allergists/ pulmonologists (myself included).    Of the three most common causes of  Sub-acute or recurrent or chronic cough, only one (GERD)  can actually contribute to/ trigger  the other two (asthma and post nasal drip syndrome)  and perpetuate the cylce of cough.  While not intuitively obvious, many patients with chronic low grade reflux do not cough until there is a primary insult that disturbs the protective epithelial barrier and exposes sensitive nerve endings.   This is typically viral but can be direct physical injury such as with an endotracheal tube.   The point is that once this occurs, it is difficult to  eliminate the cycle  using anything but a maximally effective acid suppression regimen at least in the short run, accompanied by an appropriate diet to address non acid GERD and control / eliminate the cough itself for at least 3 days with tramadol   If not better the next step is a trial of gabapentin in this setting..  Total time devoted to counseling  > 50 % of initial 60 min office visit: re-establish/  review case with pt/ discussion of options/alternatives/ personally creating written customized instructions  in presence of pt  then going over those specific  Instructions directly with the pt including how to use all of the meds but in particular covering each new medication in detail and the difference between the maintenance= "automatic" meds and the prns using an action plan format for the latter (If this problem/symptom => do that organization reading Left to right).  Please see AVS from this visit for a full list of these instructions which I personally wrote for this pt and  are unique to this visit.

## 2017-10-17 ENCOUNTER — Ambulatory Visit (INDEPENDENT_AMBULATORY_CARE_PROVIDER_SITE_OTHER)
Admission: RE | Admit: 2017-10-17 | Discharge: 2017-10-17 | Disposition: A | Discharge status: Home / Self Care | Payer: Medicare Other | Source: Ambulatory Visit | Attending: Internal Medicine | Admitting: Internal Medicine

## 2017-10-17 ENCOUNTER — Ambulatory Visit: Payer: Medicare Other | Admitting: Internal Medicine

## 2017-10-17 ENCOUNTER — Encounter: Payer: Self-pay | Admitting: Internal Medicine

## 2017-10-17 VITALS — BP 152/80 | HR 76 | Ht 65.0 in | Wt 134.6 lb

## 2017-10-17 DIAGNOSIS — R059 Cough, unspecified: Secondary | ICD-10-CM

## 2017-10-17 DIAGNOSIS — R058 Other specified cough: Secondary | ICD-10-CM

## 2017-10-17 DIAGNOSIS — R05 Cough: Secondary | ICD-10-CM

## 2017-10-17 MED ORDER — PREDNISONE 10 MG PO TABS: ORAL_TABLET | ORAL | 0 refills | Status: DC

## 2017-10-17 MED ORDER — PANTOPRAZOLE SODIUM 40 MG PO TBEC: DELAYED_RELEASE_TABLET | ORAL | 2 refills | Status: DC

## 2017-10-17 NOTE — Patient Instructions (Addendum)
Prednisone Take 4 for three days 3 for three days 2 for three days 1 for three days and stop   For drainage / throat tickle try take CHLORPHENIRAMINE  4 mg - take one every 4 hours as needed - available over the counter- may cause drowsiness so start with just a bedtime dose or two and see how you tolerate it before trying in daytime    Protonix 40 mg Take 30- 60 min before your first and last meals of the day    Please remember to go to the  x-ray department downstairs in the basement  for your tests - we will call you with the results when they are available.       Please schedule a follow up office visit in 4 weeks, sooner if needed  - needs feno on return

## 2017-10-17 NOTE — Progress Notes (Signed)
LMTCB

## 2017-10-17 NOTE — Assessment & Plan Note (Addendum)
-   sinus CT 11/02/12 > neg - Allergy Profile 10/29/2012 >> IgE  12 pos grass/  Ragweed - Failed singulair > stopped 01/04/2013  > restart monotherapy with chlortrimeton - 10/17/2017 max rx for gerd and 12 days of pred s any cough med or h1 if truly "completely better just while on prednisone"     Prednisone response suggests eos rhinitis/ bronchitis or asthma so try pred x 12 days this time but see if she really does do fine s using any other cough/rhinitis meds while  maint on max rx for gerd until returns with FENO then trial of gabapentin next options  Reviewed: The standardized cough guidelines published in Chest by Lissa Morales in 2006 are still the best available and consist of a multiple step process (up to 12!) , not a single office visit,  and are intended  to address this problem logically,  with an alogrithm dependent on response to empiric treatment at  each progressive step  to determine a specific diagnosis with  minimal addtional testing needed. Therefore if adherence is an issue or can't be accurately verified,  it's very unlikely the standard evaluation and treatment will be successful here.    Furthermore, response to therapy (other than acute cough suppression, which should only be used short term with avoidance of narcotic containing cough syrups if possible), can be a gradual process for which the patient is not likely to  perceive immediate benefit.  Unlike going to an eye doctor where the best perscription is almost always the first one and is immediately effective, this is almost never the case in the management of chronic cough syndromes. Therefore the patient needs to commit up front to consistently adhere to recommendations  for up to 6 weeks of therapy directed at the likely underlying problem(s) before the response can be reasonably evaluated.    I had an extended discussion with the patient reviewing all relevant studies completed to date and  lasting 15 to 20 minutes of a  25 minute visit    Each maintenance medication was reviewed in detail including most importantly the difference between maintenance and prns and under what circumstances the prns are to be triggered using an action plan format that is not reflected in the computer generated alphabetically organized AVS.    Please see AVS for specific instructions unique to this visit that I personally wrote and verbalized to the the pt in detail and then reviewed with pt  by my nurse highlighting any  changes in therapy recommended at today's visit to their plan of care.

## 2017-10-17 NOTE — Progress Notes (Signed)
Subjective:    Patient ID: Kristi Huerta, female    DOB: [birth year]  MRN: [id number]    Brief patient profile:  59 yowf never smoker with h/o allergies = seasonal rhinitis spring  s asthma in 20's but outgrew it in her 59's  never cough or wheeze then onset cough April 2013 referred to pulmonary clinic 10/01/2012 by Dr Derrill Memo   History of Present Illness  10/01/2012 1st pulmonary eval cc daily cough abupt onset April 2013 and persistent since then esp at [facility] with worse nasal symptoms intermittently but always the sensation of draingage even when no active nasal symptoms> never produces any mucus. Treated with clariton,  mucinex dm,  omeprazole, no change. Only better with hydrocodan then sleeps all night then recurs p stirring around for an hour daily.  rec First take delsym two tsp every 12 hours and supplement if needed with  tramadol 50 mg up to 2 every 4 hours to suppress the urge to cough.   Try protonix 40 mg Take 30-60 min before first meal of the day and Pepcid 20 mg one bedtime until  Return GERD  Diet  Prednisone 10 mg take  4 each am x 2 days,   2 each am x 2 days,  1 each am x2days and stop    10/29/2012 f/u ov/Cheyne Boulden cc transiently better on pred/tramadol and since stopped > cough   worse around 3 am > no mucus, still urge to clear the throat during the day Prednisone 10 mg take  4 each am x 2 days,   2 each am x 2 days,  1 each am x2days and stop  Take delsym two tsp every 12 hours and supplement if needed with  tramadol 50 mg up to 2 every 4 hours to suppress the urge to cough or even to clear your throat.     Chlortrimeton 4 mg at bedtime until return    11/26/2012 f/u ov/Lotta Frankenfield f/u chronic cough Chief Complaint  Patient presents with  . Follow-up    Cough is much improved since last visit, only usually coughs in the am , non prod.    rare delsym, Cough only once significantly 1 day prior to [place]  [place] rec First try chlortrimeton during the day to see if it helps the  tickle, if so I would rec add singulair as a daily medication    01/04/2013 f/u ov/Eva Vallee re cough worse on singulair and off chlortrimeton and acid supression ( did not follow inst) Chief Complaint  Patient presents with  . Follow-up    Pt states that cough is some less since the last visit. Mainly the cough is present at night and early am. Cough is non prod. She c/o sneezing and itchy, watery eyes for the past couple of wks.   rec Add back chlortrimeton 4 mg up to every 6 hours as needed and if not effective add back prilosec before bfast  and pepcid 20 mg at bedtime Stop singulair  Avoid perfumes and heavy scents   09/19/2017  ov/V[facility] Coffin re: re establish re chronic cough - did not add back gerd rx as rec last ov as a contingency Chief Complaint  Patient presents with  . Pulmonary Consult    Referred by Dr. Mayra Neer. Pt has been seen here in the past for cough, last in 2014. She states that her cough never went away and has been worse over the past year. It's esp worse at night and  she has to sleep propped up on 2 pillows. She occ produces some yellow to green sputum that she relates to nasal drainage.   around a year prior to OV  Cough worsened while not on prilosec  / ? Better on  Chlortrimeton bid  Not bothered by sob with daily activities including housekeeping/ vacuuming  Tramadol helps the most Sense of post nasal drainage better now with H1 but only using bid     Kouffman Reflux v Neurogenic Cough Differentiator Reflux Comments  Do you awaken from a sound sleep coughing violently?                            With trouble breathing? Yes but not violent    Do you have choking episodes when you cannot  Get enough air, gasping for air ?              no   Do you usually cough when you lie down into  The bed, or when you just lie down to rest ?                          No    Do you usually cough after meals or eating?         No    Do you cough when (or after) you bend over?     no   GERD SCORE     Kouffman Reflux v Neurogenic Cough Differentiator Neurogenic   Do you more-or-less cough all day long? sporadic   Does change of temperature make you cough? no   Does laughing or chuckling cause you to cough? no   Do fumes (perfume, automobile fumes, burned  Toast, etc.,) cause you to cough ?      maybe   Does speaking, singing, or talking on the phone cause you to cough   ?               Yes    Neurogenic/Airway score     rec Prednisone 10 mg take  4 each am x 2 days,   2 each am x 2 days,  1 each am x2days and stop  Pantoprazole (protonix) 40 mg   Take  30-60 min before first meal of the day and  Zantac 150 mg  one @  bedtime until return to office - this is the best way to tell whether stomach acid is contributing to your problem.   Take delsym two tsp every 12 hours and supplement if needed with  tramadol 50 mg up to 1 every 4 hours to suppress the urge to cough or even to clear your throat. Swallowing water or using ice chips/non mint and menthol containing candies (such as lifesavers or sugarless jolly ranchers) are also effective.  You should rest your voice and avoid activities that you know make you cough. Once you have eliminated the cough for 3 straight days try reducing the tramadol first,  then the delsym as tolerated.   For drainage / throat tickle try take CHLORPHENIRAMINE  4 mg - take one every 4 hours as needed -  Please schedule a follow up office visit in 4 weeks, sooner if needed  with all medications /inhalers/ solutions in hand so we can verify exactly what you are taking. This includes all medications from all doctors and over the counters     10/17/2017  f/u ov/Yug Loria re:  Cough  April 2013  Did not bring all meds as req  Chief Complaint  Patient presents with  . Follow-up    Cough has improved during the day but still keeps her awake at night.   Dyspnea:  Not a problem Cough: better daytime, worse at bedtime assoc with sense of pnds only took one h1  never tried 2  Sleep: interupted by cough even propped up on 2 pillows Prednisone eliminated the cough and the need for tramadol but still took the delsym  Overt hb resolved  No obvious day to day or daytime variability or assoc excess/ purulent sputum or mucus plugs or hemoptysis or cp or chest tightness, subjective wheeze or overt  hb symptoms. No unusual exposure hx or h/o childhood pna/ asthma or knowledge of premature birth.   Also denies any obvious fluctuation of symptoms with weather or environmental changes or other aggravating or alleviating factors except as outlined above   Current Allergies, Complete Past Medical History, Past Surgical History, Family History, and Social History were reviewed in [employer] [employer] record.  ROS  The following are not active complaints unless bolded Hoarseness, sore throat, dysphagia, dental problems, itching, sneezing,  nasal congestion or discharge of excess mucus or purulent secretions, ear ache,   fever, chills, sweats, unintended wt loss or wt gain, classically pleuritic or exertional cp,  orthopnea pnd or leg swelling, presyncope, palpitations, abdominal pain, anorexia, nausea, vomiting, diarrhea  or change in bowel habits or change in bladder habits, change in stools or change in urine, dysuria, hematuria,  rash, arthralgias, visual complaints, headache, numbness, weakness or ataxia or problems with walking or coordination,  change in mood/affect or memory.        Current Meds  Medication Sig  . aspirin 81 MG chewable tablet Chew 81 mg by mouth daily.  Marland Kitchen b complex vitamins tablet Take 1 tablet by mouth daily.  . calcium citrate-vitamin D (CITRACAL+D) 315-200 MG-UNIT tablet Take 1 tablet by mouth 2 (two) times daily.  . chlorpheniramine (CHLOR-TRIMETON) 4 MG tablet Take 4 mg by mouth at bedtime. And every 4 hours as needed  . escitalopram (LEXAPRO) 10 MG tablet Take 10 mg by mouth daily.  . Garlic 4174 MG CAPS Take 1 capsule by  mouth daily.  . hydrochlorothiazide (HYDRODIURIL) 25 MG tablet Take 25 mg by mouth daily.  . pantoprazole (PROTONIX) 40 MG tablet Take 30- 60 min before your first and last meals of the day  . pravastatin (PRAVACHOL) 20 MG tablet Take 1 tablet by mouth daily.  . ranitidine (ZANTAC) 150 MG tablet Take 150 mg by mouth at bedtime.   . traMADol (ULTRAM) 50 MG tablet 1-2 every 4 hours as needed for cough or pain  . verapamil (COVERA HS) 180 MG (CO) 24 hr tablet Take 180 mg by mouth at bedtime.  . vitamin C (ASCORBIC ACID) 500 MG tablet Take 500 mg by mouth daily.  . [DISCONTINUED] pantoprazole (PROTONIX) 40 MG tablet Take 1 tablet (40 mg total) by mouth daily. Take 30-60 min before first meal of the day                  Objective:   Physical Exam  amb wf nad   10/17/2017          134  09/19/2017          132   11/26/12 127 lb 3.2 oz (57.698 kg)  10/29/12 130 lb (58.968 kg)  10/01/12 129 lb 9.6 oz (  58.786 kg)      Vital signs reviewed - Note on arrival 02 sats  99% on RA   HEENT: nl dentition, turbinates bilaterally, and oropharynx which is prisitine. Nl external ear canals without cough reflex   NECK :  without JVD/Nodes/TM/ nl carotid upstrokes bilaterally   LUNGS: no acc muscle use,  Nl contour chest which is clear to A and P bilaterally with urge to cough at end insp   CV:  RRR  no s3 or murmur or increase in P2, and no edema   ABD:  soft and nontender with nl inspiratory excursion in the supine position. No bruits or organomegaly appreciated, bowel sounds nl  MS:  Nl gait/ ext warm without deformities, calf tenderness, cyanosis or clubbing No obvious joint restrictions   SKIN: warm and dry without lesions    NEURO:  alert, approp, nl sensorium with  no motor or cerebellar deficits apparent.          CXR PA and Lateral:   10/17/2017 :    I personally reviewed images and agree with radiology impression as follows:    Mild chronic bronchitic changes without  infiltrate.         Assessment & Plan:

## 2017-10-18 ENCOUNTER — Telehealth: Payer: Self-pay | Admitting: Internal Medicine

## 2017-10-18 NOTE — Telephone Encounter (Signed)
Notes recorded by Tanda Rockers, MD on 10/17/2017 at 12:57 PM EST Disregard prev result note and Call pt: Reviewed cxr and no acute change so no change in recommendations made at ov ------------------ Spoke with pt, aware of results/recs.  Nothing further needed.

## 2017-11-04 ENCOUNTER — Encounter (HOSPITAL_COMMUNITY): Payer: Self-pay | Admitting: *Deleted

## 2017-11-04 ENCOUNTER — Emergency Department (HOSPITAL_COMMUNITY): Payer: Medicare Other

## 2017-11-04 ENCOUNTER — Emergency Department (HOSPITAL_COMMUNITY)
Admission: EM | Admit: 2017-11-04 | Discharge: 2017-11-04 | Disposition: A | Discharge status: Home / Self Care | Payer: Medicare Other | Attending: Emergency Medicine | Admitting: Emergency Medicine

## 2017-11-04 DIAGNOSIS — Y9301 Activity, walking, marching and hiking: Secondary | ICD-10-CM | POA: Insufficient documentation

## 2017-11-04 DIAGNOSIS — Z7982 Long term (current) use of aspirin: Secondary | ICD-10-CM | POA: Diagnosis not present

## 2017-11-04 DIAGNOSIS — W101XXA Fall (on)(from) sidewalk curb, initial encounter: Secondary | ICD-10-CM | POA: Insufficient documentation

## 2017-11-04 DIAGNOSIS — S61112A Laceration without foreign body of left thumb with damage to nail, initial encounter: Secondary | ICD-10-CM | POA: Diagnosis not present

## 2017-11-04 DIAGNOSIS — S62515A Nondisplaced fracture of proximal phalanx of left thumb, initial encounter for closed fracture: Secondary | ICD-10-CM | POA: Diagnosis not present

## 2017-11-04 DIAGNOSIS — Z23 Encounter for immunization: Secondary | ICD-10-CM | POA: Diagnosis not present

## 2017-11-04 DIAGNOSIS — Y999 Unspecified external cause status: Secondary | ICD-10-CM | POA: Insufficient documentation

## 2017-11-04 DIAGNOSIS — S0990XA Unspecified injury of head, initial encounter: Secondary | ICD-10-CM | POA: Diagnosis present

## 2017-11-04 DIAGNOSIS — Z79899 Other long term (current) drug therapy: Secondary | ICD-10-CM | POA: Insufficient documentation

## 2017-11-04 DIAGNOSIS — S0083XA Contusion of other part of head, initial encounter: Secondary | ICD-10-CM | POA: Insufficient documentation

## 2017-11-04 DIAGNOSIS — T07XXXA Unspecified multiple injuries, initial encounter: Secondary | ICD-10-CM

## 2017-11-04 DIAGNOSIS — S61319A Laceration without foreign body of unspecified finger with damage to nail, initial encounter: Secondary | ICD-10-CM

## 2017-11-04 DIAGNOSIS — I10 Essential (primary) hypertension: Secondary | ICD-10-CM | POA: Insufficient documentation

## 2017-11-04 DIAGNOSIS — Y92480 Sidewalk as the place of occurrence of the external cause: Secondary | ICD-10-CM | POA: Diagnosis not present

## 2017-11-04 MED ORDER — CEPHALEXIN 500 MG PO CAPS: 500.0000 mg | ORAL_CAPSULE | Freq: Two times a day (BID) | ORAL | 0 refills | Status: DC

## 2017-11-04 MED ORDER — CHLORHEXIDINE GLUCONATE 0.12 % MT SOLN: 15.0000 mL | Freq: Two times a day (BID) | OROMUCOSAL | 0 refills | Status: DC

## 2017-11-04 MED ORDER — BACITRACIN ZINC 500 UNIT/GM EX OINT
TOPICAL_OINTMENT | Freq: Once | CUTANEOUS | Status: AC
  Administered 2017-11-04: 21:00:00 via TOPICAL
  Filled 2017-11-04: qty 0.9

## 2017-11-04 MED ORDER — TETANUS-DIPHTH-ACELL PERTUSSIS 5-2.5-18.5 LF-MCG/0.5 IM SUSP
0.5000 mL | Freq: Once | INTRAMUSCULAR | Status: AC
  Administered 2017-11-04: 0.5 mL via INTRAMUSCULAR
  Filled 2017-11-04: qty 0.5

## 2017-11-04 MED ORDER — FENTANYL CITRATE (PF) 100 MCG/2ML IJ SOLN
100.0000 ug | Freq: Once | INTRAMUSCULAR | Status: AC
  Administered 2017-11-04: 100 ug via INTRAMUSCULAR
  Filled 2017-11-04: qty 2

## 2017-11-04 MED ORDER — HYDROCODONE-ACETAMINOPHEN 5-325 MG PO TABS: 1.0000 | ORAL_TABLET | Freq: Four times a day (QID) | ORAL | 0 refills | Status: DC | PRN

## 2017-11-04 MED ORDER — LIDOCAINE HCL (PF) 1 % IJ SOLN
20.0000 mL | Freq: Once | INTRAMUSCULAR | Status: AC
  Administered 2017-11-04: 20 mL via INTRADERMAL
  Filled 2017-11-04: qty 30

## 2017-11-04 NOTE — ED Provider Notes (Signed)
[facility] DEPT Provider Note   CSN: [id number] Arrival date & time: 11/04/17  1319     History   Chief Complaint Chief Complaint  Patient presents with  . Fall  . Facial Injury    HPI Kristi Huerta is a 84 y.o. female with PMH/o GERD, HLD, HTN who presents for evaluation of facial pain after a mechanical fall that occurred at approximately 12pm this afternoon. Patient reports she was walking and missed the step on the curb, causing her to fall forward. Patient states that she did not have any LOC.  Patient reports she was able to get up from the fall and walk back to her house.  Patient reports that she initially went to an urgent care but was prompted to go to the emergency department for further evaluation.  Patient states she did not have any preceding chest pain, dizziness prior to fall.  Patient reports that she is having pain around her right eye, right face, to bilateral thumbs.  Patient does not recall when her last tetanus shot was.  Patient denies any vision changes, chest pain, difficulty breathing, dizziness, abdominal pain, nausea/vomiting, numbness/weakness of her arms or legs.  The history is provided by the patient.    Past Medical History:  Diagnosis Date  . GERD (gastroesophageal reflux disease)   . Hyperlipidemia   . Hypertension   . Sinus trouble     Patient Active Problem List   Diagnosis Date Noted  . Upper airway cough syndrome 10/01/2012    Past Surgical History:  Procedure Laterality Date  . ABDOMINAL HYSTERECTOMY    . APPENDECTOMY    . TUBAL LIGATION       OB History   None      Home Medications    Prior to Admission medications   Medication Sig Start Date End Date Taking? Authorizing Provider  aspirin 81 MG chewable tablet Chew 81 mg by mouth daily.    [provider]  b complex vitamins tablet Take 1 tablet by mouth daily.    [provider]  calcium citrate-vitamin D (CITRACAL+D)  315-200 MG-UNIT tablet Take 1 tablet by mouth 2 (two) times daily.    [provider]  cephALEXin (KEFLEX) 500 MG capsule Take 1 capsule (500 mg total) by mouth 2 (two) times daily. 11/04/17   Volanda Napoleon, PA-C  chlorhexidine (PERIDEX) 0.12 % solution Use as directed 15 mLs in the mouth or throat 2 (two) times daily. 11/04/17   Volanda Napoleon, PA-C  chlorpheniramine (CHLOR-TRIMETON) 4 MG tablet Take 4 mg by mouth at bedtime. And every 4 hours as needed    [provider]  escitalopram (LEXAPRO) 10 MG tablet Take 10 mg by mouth daily.    [provider]  Garlic 9449 MG CAPS Take 1 capsule by mouth daily.    [provider]  hydrochlorothiazide (HYDRODIURIL) 25 MG tablet Take 25 mg by mouth daily.    [provider]  HYDROcodone-acetaminophen (NORCO/VICODIN) 5-325 MG tablet Take 1-2 tablets by mouth every 6 (six) hours as needed. 11/04/17   Volanda Napoleon, PA-C  pantoprazole (PROTONIX) 40 MG tablet Take 30- 60 min before your first and last meals of the day 10/17/17   Tanda Rockers, MD  pravastatin (PRAVACHOL) 20 MG tablet Take 1 tablet by mouth daily. 12/13/12   [provider]  predniSONE (DELTASONE) 10 MG tablet Take Prednisone  4 for three days 3 for three days 2 for three days  1 for three days and stop 10/17/17   Tanda Rockers, MD  ranitidine (ZANTAC) 150 MG tablet Take 150 mg by mouth at bedtime.     [provider]  traMADol Veatrice Bourbon) 50 MG tablet 1-2 every 4 hours as needed for cough or pain 09/19/17   Tanda Rockers, MD  verapamil (COVERA HS) 180 MG (CO) 24 hr tablet Take 180 mg by mouth at bedtime.    [provider]  vitamin C (ASCORBIC ACID) 500 MG tablet Take 500 mg by mouth daily.    [provider]    Family History Family History  Problem Relation Age of Onset  . Asthma Brother   . Heart disease Mother   . Heart disease Father   . Cancer Brother        lung    Social History Social History     Tobacco Use  . Smoking status: Never Smoker  . Smokeless tobacco: Never Used  Substance Use Topics  . Alcohol use: Not Currently  . Drug use: Not on file     Allergies   Patient has no known allergies.   Review of Systems Review of Systems  Constitutional: Negative for chills and fever.  HENT: Positive for facial swelling. Negative for congestion.   Eyes: Negative for visual disturbance.  Respiratory: Negative for cough and shortness of breath.   Cardiovascular: Negative for chest pain.  Gastrointestinal: Negative for abdominal pain, diarrhea, nausea and vomiting.  Genitourinary: Negative for dysuria and hematuria.  Musculoskeletal: Negative for back pain and neck pain.  Skin: Positive for wound.  Neurological: Negative for dizziness, weakness, numbness and headaches.  All other systems reviewed and are negative.    Physical Exam Updated Vital Signs BP (!) 162/78 (BP Location: Left Arm)   Pulse 73   Temp 97.7 F (36.5 C) (Oral)   Resp 15   SpO2 99%   Physical Exam  Constitutional: She is oriented to person, place, and time. She appears well-developed and well-nourished.  HENT:  Head: Normocephalic and atraumatic.  Mouth/Throat: Oropharynx is clear and moist and mucous membranes are normal.  Patient was scattered superficial abrasions noted to the lateral right lateral side of face.  Patient with right periorbital swelling, ecchymosis.  Tenderness noted to the lower periorbital region on the right.  Patient was some tenderness to the right nasal bridge with some overlying soft tissue swelling.  Dentition appears intact patient states that when she bites down, she does have pain on the right side.  Patient has 2 small lip lacerations noted to the anterior upper lip.  Does not appear to go through and through.  Eyes: Pupils are equal, round, and reactive to light. Conjunctivae, EOM and lids are normal.  EOMs intact.  Neck: Full passive range of motion without pain.   Cardiovascular: Normal rate, regular rhythm, normal heart sounds and normal pulses. Exam reveals no gallop and no friction rub.  No murmur heard. Pulses:      Radial pulses are 2+ on the right side, and 2+ on the left side.       Dorsalis pedis pulses are 2+ on the right side, and 2+ on the left side.  Pulmonary/Chest: Effort normal and breath sounds normal.  Abdominal: Soft. Normal appearance. There is no tenderness. There is no rigidity and no guarding.  Musculoskeletal: Normal range of motion.  Tenderness to palpation noted to the distal aspect of the left thumb.  The lateral nail bed is displaced from the nail.  Laceration noted to the distal aspect of the thumb.  Tenderness to palpation noted to the distal aspect of the right thumb.  Scattered abrasions noted to bilateral hands.  Neurological: She is alert and oriented to person, place, and time.  Cranial nerves III-XII intact Follows commands, Moves all extremities  5/5 strength to BUE and BLE  Sensation intact throughout all major nerve distributions Normal finger to nose. No dysdiadochokinesia. No pronator drift. No gait abnormalities  No slurred speech. No facial droop.   Skin: Skin is warm and dry. Capillary refill takes less than 2 seconds.  Left thumb with a circumlinear laceration noted to the distal aspect that wraps around into the lateral nail bed.  Right thumb has an abrasion noted to the distal tip.  Good cap refill bilaterally.  Psychiatric: She has a normal mood and affect. Her speech is normal.  Nursing note and vitals reviewed.    ED Treatments / Results  Labs (all labs ordered are listed, but only abnormal results are displayed) Labs Reviewed - No data to display  EKG None  Radiology Ct Head Wo Contrast  Result Date: 11/04/2017 CLINICAL DATA:  Fall with facial injury while walking up steps. EXAM: CT HEAD WITHOUT CONTRAST CT MAXILLOFACIAL WITHOUT CONTRAST TECHNIQUE: Multidetector CT imaging of the head and  maxillofacial structures were performed using the standard protocol without intravenous contrast. Multiplanar CT image reconstructions of the maxillofacial structures were also generated. COMPARISON:  None. FINDINGS: CT HEAD FINDINGS Brain: Ventricles, cisterns and other CSF spaces are within normal. Two small hypodensities just inferior to the left lentiform nucleus likely prominent perivascular spaces versus old lacune infarcts. No mass, mass effect, shift of midline structures or acute hemorrhage. No evidence of acute infarction. Vascular: No hyperdense vessel or unexpected calcification. Skull: Normal. Negative for fracture or focal lesion. Other: Right periorbital soft tissue swelling. CT MAXILLOFACIAL FINDINGS Osseous: No acute facial bone fracture. Degenerate change of the spine. Orbits: Mild right periorbital soft tissue swelling. Globes and retrobulbar spaces are normal and symmetric. Sinuses: Subtle opacification over the right frontal ethmoidal recess and anterior right ethmoid air cells. No air-fluid levels. Mastoid air cells are clear. Soft tissues: Mild right periorbital soft tissue swelling.Minimal soft tissue swelling over the anterior right mid face. IMPRESSION: No acute brain injury. Two small adjacent prominent perivascular spaces versus old lacunar infarcts left inferior basal ganglia region. No acute facial bone fracture. Soft tissue swelling over the right periorbital region and anterior right mid face. Electronically Signed   By: Marin Olp M.D.   On: 11/04/2017 17:42   Dg Hand Complete Left  Result Date: 11/04/2017 CLINICAL DATA:  Fall with right hand and thumb pain. EXAM: LEFT HAND - COMPLETE 3+ VIEW COMPARISON:  None. FINDINGS: There are degenerate changes of the wrist, first carpometacarpal joint as well as the interphalangeal joints. Subtle ulnar subluxation of the third middle phalanx on the proximal phalanx likely chronic. Mild irregularity along the radial aspect of the head of  the first proximal phalanx as this likely represents a subtle chip fracture. Remainder the exam is unremarkable. IMPRESSION: Subtle chip fracture involving the radial aspect of the head of the first proximal phalanx. Degenerative changes as described. Electronically Signed   By: Marin Olp M.D.   On: 11/04/2017 17:09   Dg Hand Complete Right  Result Date: 11/04/2017 CLINICAL DATA:  Thumb injury after a fall. EXAM: RIGHT HAND - COMPLETE 3+ VIEW COMPARISON:  None. FINDINGS: Three-view exam shows diffuse bony demineralization. There is no  gross fracture or dislocation involving the bones of the thumb. A tiny fragment adjacent to the base of the distal phalanx may represent a cortical avulsion injury. Advanced degenerative changes are seen in the DIP joints with some minimal degenerative changes in the PIP joints as well. Degenerative change also noted in the first carpometacarpal joint. IMPRESSION: 1. Tiny fragment adjacent to the base of the thumb distal phalanx may be nonacute but avulsion injury could have this appearance. 2. Diffuse degenerative changes in the IP joints and first carpometacarpal joint. Electronically Signed   By: Misty Stanley M.D.   On: 11/04/2017 17:06   Ct Maxillofacial Wo Contrast  Result Date: 11/04/2017 CLINICAL DATA:  Fall with facial injury while walking up steps. EXAM: CT HEAD WITHOUT CONTRAST CT MAXILLOFACIAL WITHOUT CONTRAST TECHNIQUE: Multidetector CT imaging of the head and maxillofacial structures were performed using the standard protocol without intravenous contrast. Multiplanar CT image reconstructions of the maxillofacial structures were also generated. COMPARISON:  None. FINDINGS: CT HEAD FINDINGS Brain: Ventricles, cisterns and other CSF spaces are within normal. Two small hypodensities just inferior to the left lentiform nucleus likely prominent perivascular spaces versus old lacune infarcts. No mass, mass effect, shift of midline structures or acute hemorrhage. No  evidence of acute infarction. Vascular: No hyperdense vessel or unexpected calcification. Skull: Normal. Negative for fracture or focal lesion. Other: Right periorbital soft tissue swelling. CT MAXILLOFACIAL FINDINGS Osseous: No acute facial bone fracture. Degenerate change of the spine. Orbits: Mild right periorbital soft tissue swelling. Globes and retrobulbar spaces are normal and symmetric. Sinuses: Subtle opacification over the right frontal ethmoidal recess and anterior right ethmoid air cells. No air-fluid levels. Mastoid air cells are clear. Soft tissues: Mild right periorbital soft tissue swelling.Minimal soft tissue swelling over the anterior right mid face. IMPRESSION: No acute brain injury. Two small adjacent prominent perivascular spaces versus old lacunar infarcts left inferior basal ganglia region. No acute facial bone fracture. Soft tissue swelling over the right periorbital region and anterior right mid face. Electronically Signed   By: Marin Olp M.D.   On: 11/04/2017 17:42    Procedures .Marland KitchenLaceration Repair Date/Time: 11/04/2017 7:04 PM Performed by: Volanda Napoleon, PA-C Authorized by: Volanda Napoleon, PA-C   Consent:    Consent obtained:  Verbal   Consent given by:  Patient   Risks discussed:  Infection and poor cosmetic result Anesthesia (see MAR for exact dosages):    Anesthesia method:  Nerve block   Block needle gauge:  25 G   Block anesthetic:  Lidocaine 1% WITH epi   Block injection procedure:  Anatomic landmarks identified, introduced needle, incremental injection and negative aspiration for blood Laceration details:    Location:  Finger   Finger location:  L thumb Repair type:    Repair type:  Simple Pre-procedure details:    Preparation:  Patient was prepped and draped in usual sterile fashion Exploration:    Hemostasis achieved with:  Direct pressure   Wound extent: no foreign bodies/material noted   Treatment:    Area cleansed with:  Betadine and  saline   Amount of cleaning:  Extensive   Irrigation solution:  Sterile saline   Irrigation method:  Syringe   Visualized foreign bodies/material removed: no   Skin repair:    Repair method:  Sutures   Suture size:  5-0   Wound skin closure material used: Vicryl.   Suture technique:  Simple interrupted   Number of sutures:  5 Approximation:    Approximation:  Loose Post-procedure details:    Dressing:  Antibiotic ointment and splint for protection Comments:     Once patient was properly anesthetized, the wound was thoroughly and extensively irrigated with sterile saline.  Evaluation of the full wound showed a small laceration to the distal tip of the thumb that extends into the lateral nail bed.  The nail was removed which showed a small laceration noted into the mid nail bed.  The laceration and nailbed were repaired using Vicryl and the area was splinted for support and stabilization.   (including critical care time)  Medications Ordered in ED Medications  Tdap (BOOSTRIX) injection 0.5 mL (0.5 mLs Intramuscular Given 11/04/17 1802)  lidocaine (PF) (XYLOCAINE) 1 % injection 20 mL (20 mLs Intradermal Given 11/04/17 1803)  fentaNYL (SUBLIMAZE) injection 100 mcg (100 mcg Intramuscular Given 11/04/17 1801)  bacitracin ointment ( Topical Given 11/04/17 2052)     Initial Impression / Assessment and Plan / ED Course  I have reviewed the triage vital signs and the nursing notes.  Pertinent labs & imaging results that were available during my care of the patient were reviewed by me and considered in my medical decision making (see chart for details).     84 year old female who presents for evaluation after mechanical fall that occurred this afternoon.  Patient missed a step, causing her to fall forward and landed on her face.  No preceding chest pain, dizziness.  Patient is currently on aspirin.  Patient states that she had no LOC, has not had any vomiting since the incident.  On ED arrival,  is complaining of right-sided facial pain, bilateral hand pain. Patient is afebrile, non-toxic appearing, sitting comfortably on examination table.  Vital signs reviewed.  Patient is hypertensive.  Likely secondary to pain.  Will give pain medication and reassess.  Plan to check CT imaging of head and face for evaluation of any acute intracranial abnormality, facial fracture.  Will plan for x-rays of hands given distribution of pain and evaluation for any fracture underneath the lacerations.  Patient unclear on when her last tetanus shot was.  We will plan update tetanus here in the ED.  Wound care provided in the ED.  Right hand x-ray shows a tiny fragment adjacent to the base of the thumb at the distal phalanx.  Unclear if it is not acute.  Left hand x-ray social chip fracture involving the radial aspect of the head of the first proximal phalanx.  This correlates to where patient is tender.  CT head negative for any acute abnormalities.  CT maxillofacial without any evidence of facial or orbit fracture.  Laceration repaired as documented above.  Discussed with Dr. Grandville Silos (Hand).  We will plan to see patient in outpatient follow-up for evaluation of left distal thumb laceration and fracture.   Patient ambulated in the department without any difficulty.  She has no difficulty bearing weight on lower extremities.  Normal gait.  Repeat blood pressure after analgesics is improved.  Patient stable for discharge at this time.  Patient instructed to follow-up with hand doctor as instructed.  Wound care instructions discussed with patient. Patient had ample opportunity for questions and discussion. All patient's questions were answered with full understanding. Strict return precautions discussed. Patient expresses understanding and agreement to plan.   Final Clinical Impressions(s) / ED Diagnoses   Final diagnoses:  Facial hematoma, initial encounter  Multiple abrasions  Closed nondisplaced fracture of  proximal phalanx of left thumb, initial encounter  Nailbed laceration, finger, initial encounter  ED Discharge Orders        Ordered    cephALEXin (KEFLEX) 500 MG capsule  2 times daily     11/04/17 2013    HYDROcodone-acetaminophen (NORCO/VICODIN) 5-325 MG tablet  Every 6 hours PRN     11/04/17 2013    chlorhexidine (PERIDEX) 0.12 % solution  2 times daily     11/04/17 2019       Desma Mcgregor 11/05/17 0056    Lacretia Leigh, MD 11/05/17 [phone]

## 2017-11-04 NOTE — ED Triage Notes (Addendum)
Pt sent here from [facility] walk in clinic for fall/facial injury. Pt lost footing while walking up step and fell on her right face and both thumbs. Pt has bruising and abrasions around right eye, cut on right upper inner lip. Pt has abrasion to right hand. Pt's wounds were cleaned at clinic, pt was sent d/t concern for head injury/fracture. Pt is on baby aspirin.

## 2017-11-04 NOTE — ED Notes (Signed)
Patient transported to CT 

## 2017-11-04 NOTE — Discharge Instructions (Signed)
You can take tylenol 1000 mg three times for pain. You can take the pain medication for severe or breakthrough pain.  Take antibiotics as directed. Please take all of your antibiotics until finished.  Follow-up with referred hand doctor as directed in the paperwork.  He will call you and arrange for a time to go to his office.  Return to the emergency department for any vision abnormalities, worsening pain, difficulty breathing, redness or swelling of the hands, fever or any other worsening or concerning symptoms.

## 2017-11-04 NOTE — ED Notes (Signed)
No respiratory or acute distress noted alert and oriented x 3 visitors at bedside call light in reach.

## 2017-11-04 NOTE — ED Notes (Signed)
Dressing to left and right hand cleaned up with sterile water bacitricin and 2x2 and kling dressing.

## 2017-11-14 ENCOUNTER — Encounter: Payer: Self-pay | Admitting: Internal Medicine

## 2017-11-14 ENCOUNTER — Ambulatory Visit: Payer: Medicare Other | Admitting: Internal Medicine

## 2017-11-14 VITALS — BP 136/80 | HR 78 | Ht 65.0 in | Wt 132.4 lb

## 2017-11-14 DIAGNOSIS — R05 Cough: Secondary | ICD-10-CM | POA: Diagnosis not present

## 2017-11-14 DIAGNOSIS — R058 Other specified cough: Secondary | ICD-10-CM

## 2017-11-14 DIAGNOSIS — R059 Cough, unspecified: Secondary | ICD-10-CM

## 2017-11-14 LAB — NITRIC OXIDE: NITRIC OXIDE: 18

## 2017-11-14 NOTE — Progress Notes (Signed)
Subjective:    Patient ID: Kristi Huerta, female    DOB: 11/03/1933  MRN: [id number]    Brief patient profile:  64 yowf never smoker with h/o allergies = seasonal rhinitis spring  s asthma in 20's but outgrew it in her 86's  never cough or wheeze then onset cough April 2013 referred to pulmonary clinic 10/01/2012 by Dr Derrill Memo   History of Present Illness  10/01/2012 1st pulmonary eval cc daily cough abupt onset April 2013 and persistent since then esp at hs assoc with worse nasal symptoms intermittently but always the sensation of draingage even when no active nasal symptoms> never produces any mucus. Treated with clariton,  mucinex dm,  omeprazole, no change. Only better with hydrocodan then sleeps all night then recurs p stirring around for an hour daily.  rec First take delsym two tsp every 12 hours and supplement if needed with  tramadol 50 mg up to 2 every 4 hours to suppress the urge to cough.   Try protonix 40 mg Take 30-60 min before first meal of the day and Pepcid 20 mg one bedtime until  Return GERD  Diet  Prednisone 10 mg take  4 each am x 2 days,   2 each am x 2 days,  1 each am x2days and stop    10/29/2012 f/u ov/Kristi Huerta cc transiently better on pred/tramadol and since stopped > cough   worse around 3 am > no mucus, still urge to clear the throat during the day Prednisone 10 mg take  4 each am x 2 days,   2 each am x 2 days,  1 each am x2days and stop  Take delsym two tsp every 12 hours and supplement if needed with  tramadol 50 mg up to 2 every 4 hours to suppress the urge to cough or even to clear your throat.     Chlortrimeton 4 mg at bedtime until return    11/26/2012 f/u ov/Kristi Huerta f/u chronic cough Chief Complaint  Patient presents with  . Follow-up    Cough is much improved since last visit, only usually coughs in the am , non prod.    rare delsym, Cough only once significantly 1 day prior to [place]  [place] rec First try chlortrimeton during the day to see if it helps the  tickle, if so I would rec add singulair as a daily medication    01/04/2013 f/u ov/Kristi Huerta re cough worse on singulair and off chlortrimeton and acid supression ( did not follow inst) Chief Complaint  Patient presents with  . Follow-up    Pt states that cough is some less since the last visit. Mainly the cough is present at night and early am. Cough is non prod. She c/o sneezing and itchy, watery eyes for the past couple of wks.   rec Add back chlortrimeton 4 mg up to every 6 hours as needed and if not effective add back prilosec before bfast  and pepcid 20 mg at bedtime Stop singulair  Avoid perfumes and heavy scents   09/19/2017  ov/Kristi Huerta re: re establish re chronic cough - did not add back gerd rx as rec last ov as a contingency Chief Complaint  Patient presents with  . Pulmonary Consult    Referred by Dr. Mayra Neer. Pt has been seen here in the past for cough, last in 2014. She states that her cough never went away and has been worse over the past year. It's esp worse at night and  she has to sleep propped up on 2 pillows. She occ produces some yellow to green sputum that she relates to nasal drainage.   around a year prior to OV  Cough worsened while not on prilosec  / ? Better on  Chlortrimeton bid  Not bothered by sob with daily activities including housekeeping/ vacuuming  Tramadol helps the most Sense of post nasal drainage better now with H1 but only using bid     Kouffman Reflux v Neurogenic Cough Differentiator Reflux Comments  Do you awaken from a sound sleep coughing violently?                            With trouble breathing? Yes but not violent    Do you have choking episodes when you cannot  Get enough air, gasping for air ?              no   Do you usually cough when you lie down into  The bed, or when you just lie down to rest ?                          No    Do you usually cough after meals or eating?         No    Do you cough when (or after) you bend over?     no   GERD SCORE     Kouffman Reflux v Neurogenic Cough Differentiator Neurogenic   Do you more-or-less cough all day long? sporadic   Does change of temperature make you cough? no   Does laughing or chuckling cause you to cough? no   Do fumes (perfume, automobile fumes, burned  Toast, etc.,) cause you to cough ?      maybe   Does speaking, singing, or talking on the phone cause you to cough   ?               Yes    Neurogenic/Airway score     rec Prednisone 10 mg take  4 each am x 2 days,   2 each am x 2 days,  1 each am x2days and stop  Pantoprazole (protonix) 40 mg   Take  30-60 min before first meal of the day and  Zantac 150 mg  one @  bedtime until return to office - this is the best way to tell whether stomach acid is contributing to your problem.   Take delsym two tsp every 12 hours and supplement if needed with  tramadol 50 mg up to 1 every 4 hours to suppress the urge to cough or even to clear your throat. Swallowing water or using ice chips/non mint and menthol containing candies (such as lifesavers or sugarless jolly ranchers) are also effective.  You should rest your voice and avoid activities that you know make you cough. Once you have eliminated the cough for 3 straight days try reducing the tramadol first,  then the delsym as tolerated.   For drainage / throat tickle try take CHLORPHENIRAMINE  4 mg - take one every 4 hours as needed -  Please schedule a follow up office visit in 4 weeks, sooner if needed  with all medications /inhalers/ solutions in hand so we can verify exactly what you are taking. This includes all medications from all doctors and over the counters     10/17/2017  f/u ov/Kristi Huerta re:  Cough  April 2013  Did not bring all meds as req  Chief Complaint  Patient presents with  . Follow-up    Cough has improved during the day but still keeps her awake at night.   Dyspnea:  Not a problem Cough: better daytime, worse at bedtime assoc with sense of pnds only took one h1  never tried 2  Sleep: interupted by cough even propped up on 2 pillows Prednisone eliminated the cough and the need for tramadol but still took the delsym  Overt hb resolved rec Prednisone Take 4 for three days 3 for three days 2 for three days 1 for three days and stop  For drainage / throat tickle try take CHLORPHENIRAMINE  4 mg - take one every 4 hours as needed - available over the counter- may cause drowsiness so start with just a bedtime dose or two and see how you tolerate it before trying in daytime   Protonix 40 mg Take 30- 60 min before your first and last meals of the day  Please remember to go to the  x-ray department downstairs in the basement  for your tests - we will call you with the results when they are available.  Please schedule a follow up office visit in 4 weeks, sooner if needed  - needs feno on return      11/14/2017  f/u ov/Elier Zellars re: pred completed on 10/29/17 / cough started back end of March  219  Chief Complaint  Patient presents with  . Follow-up    Cough is abouth the same. No new co's today.   Dyspnea:  Not limited by breathing from desired activities   Cough: gone completely until sev days prior to ov, did not take h1 as rec when started back up attributed to "allergies draining down my throat"  Sleep: fine night before ov but sometimes needs tramadol at hs due to cough since stopped pred  SABA use:  None    No obvious day to day or daytime variability or assoc excess/ purulent sputum or mucus plugs or hemoptysis or cp or chest tightness, subjective wheeze or overt sinus or hb symptoms. No unusual exposure hx or h/o childhood pna/ asthma or knowledge of premature birth.  Sleeping ok flat without nocturnal  or early am exacerbation  of respiratory  c/o's or need for noct saba. Also denies any obvious fluctuation of symptoms with weather or environmental changes or other aggravating or alleviating factors except as outlined above   Current Allergies, Complete  Past Medical History, Past Surgical History, Family History, and Social History were reviewed in [employer] record.  ROS  The following are not active complaints unless bolded Hoarseness, sore throat, dysphagia, dental problems, itching, sneezing,  nasal congestion or discharge of excess mucus or purulent secretions, ear ache,   fever, chills, sweats, unintended wt loss or wt gain, classically pleuritic or exertional cp,  orthopnea pnd or leg swelling, presyncope, palpitations, abdominal pain, anorexia, nausea, vomiting, diarrhea  or change in bowel habits or change in bladder habits, change in stools or change in urine, dysuria, hematuria,  rash, arthralgias p tripped over curb at home/ multiple ext injuries, visual complaints, headache, numbness, weakness or ataxia or problems with walking or coordination,  change in mood/affect or memory.        Current Meds  Medication Sig  . aspirin 81 MG chewable tablet Chew 81 mg by mouth daily.  Marland Kitchen b complex vitamins tablet Take 1 tablet by  mouth daily.  . calcium citrate-vitamin D (CITRACAL+D) 315-200 MG-UNIT tablet Take 1 tablet by mouth 2 (two) times daily.  . chlorhexidine (PERIDEX) 0.12 % solution Use as directed 15 mLs in the mouth or throat 2 (two) times daily.  . chlorpheniramine (CHLOR-TRIMETON) 4 MG tablet Take 4 mg by mouth at bedtime. And every 4 hours as needed  . escitalopram (LEXAPRO) 10 MG tablet Take 10 mg by mouth daily.  . Garlic 2836 MG CAPS Take 1 capsule by mouth daily.  . hydrochlorothiazide (HYDRODIURIL) 25 MG tablet Take 25 mg by mouth daily.  . pantoprazole (PROTONIX) 40 MG tablet Take 30- 60 min before your first and last meals of the day  . pravastatin (PRAVACHOL) 20 MG tablet Take 1 tablet by mouth daily.  . traMADol (ULTRAM) 50 MG tablet 1-2 every 4 hours as needed for cough or pain  . verapamil (COVERA HS) 180 MG (CO) 24 hr tablet Take 180 mg by mouth at bedtime.  . vitamin C (ASCORBIC ACID) 500 MG  tablet Take 500 mg by mouth daily.                         Objective:   Physical Exam  amb wf with obvious bruise under R eye  10/17/2017          134  09/19/2017          132   11/26/12 127 lb 3.2 oz (57.698 kg)  10/29/12 130 lb (58.968 kg)  10/01/12 129 lb 9.6 oz (58.786 kg)       Vital signs reviewed - Note on arrival 02 sats  94% on RA      HEENT: nl dentition, turbinates bilaterally, and oropharynx. Nl external ear canals without cough reflex   NECK :  without JVD/Nodes/TM/ nl carotid upstrokes bilaterally   LUNGS: no acc muscle use,  Nl contour chest with ? Subtle  bronchovesicular changes in both bases and cough on deep inspiratory  maneuvers   CV:  RRR  no s3 or murmur or increase in P2, and no edema   ABD:  soft and nontender with nl inspiratory excursion in the supine position. No bruits or organomegaly appreciated, bowel sounds nl  MS:  Nl gait/ ext warm without deformities, calf tenderness, cyanosis or clubbing No obvious joint restrictions   SKIN: warm and dry without lesions    NEURO:  alert, approp, nl sensorium with  no motor or cerebellar deficits apparent.              Assessment & Plan:

## 2017-11-14 NOTE — Patient Instructions (Signed)
Please see patient coordinator before you leave today  to schedule Ct of chest in 2 weeks, no sooner   For drainage / throat tickle try take CHLORPHENIRAMINE  4 mg - take one every 4 hours as needed - available over the counter- may cause drowsiness so start with just a bedtime dose or two and see how you tolerate it before trying in daytime    Please schedule a follow up office visit in 4 weeks, sooner if needed  with all medications /inhalers/ solutions in hand so we can verify exactly what you are taking. This includes all medications from all doctors and over the counters

## 2017-11-14 NOTE — Assessment & Plan Note (Addendum)
Followed in Pulmonary clinic/ [facility]/ Kristi Huerta  - sinus CT 11/02/12 > neg - Allergy Profile 10/29/2012 >> IgE  12 pos grass/  Ragweed - Failed singulair > stopped 01/04/2013  > restart monotherapy with chlortrimeton - 10/17/2017 max rx for gerd and 12 days of pred s any cough med or h1 if truly "completely better just while on prednisone" and appears again that this was probably the case    - CT sinus 11/04/17  Subtle opacification over the right frontal ethmoidal recess and anterior right ethmoid air cells. No air-fluid levels. Mastoid air cells are clear - FENO 11/14/2017  =   18   Responsiveness to steroids is impressive and suggests cough variant asthma/ eos bronchitis or sinusitis or possibly steroid resp ILD >  to sort though dx rx more aggressive h1 rx/ continue gerd rx/ and if still coughing and sense need for pred rec HRCT/ methacholine challenge    I had an extended discussion with the patient reviewing all relevant studies completed to date and  lasting 15 to 20 minutes of a 25 minute visit    Each maintenance medication was reviewed in detail including most importantly the difference between maintenance and prns and under what circumstances the prns are to be triggered using an action plan format that is not reflected in the computer generated alphabetically organized AVS.    Please see AVS for specific instructions unique to this visit that I personally wrote and verbalized to the the pt in detail and then reviewed with pt  by my nurse highlighting any  changes in therapy recommended at today's visit to their plan of care.

## 2017-11-28 ENCOUNTER — Ambulatory Visit (INDEPENDENT_AMBULATORY_CARE_PROVIDER_SITE_OTHER)
Admission: RE | Admit: 2017-11-28 | Discharge: 2017-11-28 | Disposition: A | Discharge status: Home / Self Care | Payer: Medicare Other | Source: Ambulatory Visit | Attending: Internal Medicine | Admitting: Internal Medicine

## 2017-11-28 DIAGNOSIS — R059 Cough, unspecified: Secondary | ICD-10-CM

## 2017-11-28 DIAGNOSIS — R05 Cough: Secondary | ICD-10-CM | POA: Diagnosis not present

## 2017-11-28 DIAGNOSIS — R058 Other specified cough: Secondary | ICD-10-CM

## 2017-11-30 ENCOUNTER — Encounter: Payer: Self-pay | Admitting: Internal Medicine

## 2017-11-30 DIAGNOSIS — J479 Bronchiectasis, uncomplicated: Secondary | ICD-10-CM | POA: Insufficient documentation

## 2017-11-30 NOTE — Progress Notes (Signed)
Spoke with pt and notified of results per Dr. Wert. Pt verbalized understanding and denied any questions. 

## 2017-12-12 ENCOUNTER — Encounter: Payer: Self-pay | Admitting: Internal Medicine

## 2017-12-12 ENCOUNTER — Ambulatory Visit: Payer: Medicare Other | Admitting: Internal Medicine

## 2017-12-12 VITALS — BP 130/80 | HR 75 | Ht 65.0 in | Wt 134.0 lb

## 2017-12-12 DIAGNOSIS — R058 Other specified cough: Secondary | ICD-10-CM

## 2017-12-12 DIAGNOSIS — R05 Cough: Secondary | ICD-10-CM | POA: Diagnosis not present

## 2017-12-12 DIAGNOSIS — J479 Bronchiectasis, uncomplicated: Secondary | ICD-10-CM

## 2017-12-12 DIAGNOSIS — R059 Cough, unspecified: Secondary | ICD-10-CM

## 2017-12-12 NOTE — Progress Notes (Signed)
Subjective:   Patient ID: Kristi Huerta, female    DOB: [birth year]  MRN: [id number]    Brief patient profile:  79 yowf never smoker with h/o allergies = seasonal rhinitis spring  s asthma in 20's but outgrew it in her 64's  never cough or wheeze then onset cough April 2013 referred to pulmonary clinic 10/01/2012 by Dr Derrill Memo   History of Present Illness  10/01/2012 1st pulmonary eval cc daily cough abupt onset April 2013 and persistent since then esp at [facility] with worse nasal symptoms intermittently but always the sensation of draingage even when no active nasal symptoms> never produces any mucus. Treated with clariton,  mucinex dm,  omeprazole, no change. Only better with hydrocodan then sleeps all night then recurs p stirring around for an hour daily.  rec First take delsym two tsp every 12 hours and supplement if needed with  tramadol 50 mg up to 2 every 4 hours to suppress the urge to cough.   Try protonix 40 mg Take 30-60 min before first meal of the day and Pepcid 20 mg one bedtime until  Return GERD  Diet  Prednisone 10 mg take  4 each am x 2 days,   2 each am x 2 days,  1 each am x2days and stop      01/04/2013 f/u ov/Kristi Huerta re cough worse on singulair and off chlortrimeton and acid supression ( did not follow inst) Chief Complaint  Patient presents with  . Follow-up    Pt states that cough is some less since the last visit. Mainly the cough is present at night and early am. Cough is non prod. She c/o sneezing and itchy, watery eyes for the past couple of wks.   rec Add back chlortrimeton 4 mg up to every 6 hours as needed and if not effective add back prilosec before bfast  and pepcid 20 mg at bedtime Stop singulair  Avoid perfumes and heavy scents      09/19/2017  ov/Kristi Huerta re: re establish re chronic cough - did not add back gerd rx as rec last ov as a contingency Chief Complaint  Patient presents with  . Pulmonary Consult    Referred by Dr. Mayra Neer. Pt has been  seen here in the past for cough, last in 2014. She states that her cough never went away and has been worse over the past year. It's esp worse at night and she has to sleep propped up on 2 pillows. She occ produces some yellow to green sputum that she relates to nasal drainage.   around a year prior to OV  Cough worsened while not on prilosec  / ? Better on  Chlortrimeton bid  Not bothered by sob with daily activities including housekeeping/ vacuuming  Tramadol helps the most Sense of post nasal drainage better now with H1 but only using bid  Kouffman Reflux v Neurogenic Cough Differentiator Reflux Comments  Do you awaken from a sound sleep coughing violently?                            With trouble breathing? Yes but not violent    Do you have choking episodes when you cannot  Get enough air, gasping for air ?              no   Do you usually cough when you lie down into  The bed, or when you just lie down  to rest ?                          No    Do you usually cough after meals or eating?         No    Do you cough when (or after) you bend over?    no   GERD SCORE     Kouffman Reflux v Neurogenic Cough Differentiator Neurogenic   Do you more-or-less cough all day long? sporadic   Does change of temperature make you cough? no   Does laughing or chuckling cause you to cough? no   Do fumes (perfume, automobile fumes, burned  Toast, etc.,) cause you to cough ?      maybe   Does speaking, singing, or talking on the phone cause you to cough   ?               Yes    Neurogenic/Airway score     rec Prednisone 10 mg take  4 each am x 2 days,   2 each am x 2 days,  1 each am x2days and stop  Pantoprazole (protonix) 40 mg   Take  30-60 min before first meal of the day and  Zantac 150 mg  one @  bedtime until return to office - this is the best way to tell whether stomach acid is contributing to your problem.   Take delsym two tsp every 12 hours and supplement if needed with  tramadol 50 mg up to 1  every 4 hours to suppress the urge to cough or even to clear your throat. Swallowing water or using ice chips/non mint and menthol containing candies (such as lifesavers or sugarless jolly ranchers) are also effective.  You should rest your voice and avoid activities that you know make you cough. Once you have eliminated the cough for 3 straight days try reducing the tramadol first,  then the delsym as tolerated.   For drainage / throat tickle try take CHLORPHENIRAMINE  4 mg - take one every 4 hours as needed -  Please schedule a follow up office visit in 4 weeks, sooner if needed  with all medications /inhalers/ solutions in hand so we can verify exactly what you are taking. This includes all medications from all doctors and over the counters     10/17/2017  f/u ov/Kristi Huerta re:  Cough  April 2013  Did not bring all meds as req  Chief Complaint  Patient presents with  . Follow-up    Cough has improved during the day but still keeps her awake at night.   Dyspnea:  Not a problem Cough: better daytime, worse at bedtime assoc with sense of pnds only took one h1 never tried 2  Sleep: interupted by cough even propped up on 2 pillows Prednisone eliminated the cough and the need for tramadol but still took the delsym  Overt hb resolved rec Prednisone Take 4 for three days 3 for three days 2 for three days 1 for three days and stop  For drainage / throat tickle try take CHLORPHENIRAMINE  4 mg - take one every 4 hours as needed - available over the counter- may cause drowsiness so start with just a bedtime dose or two and see how you tolerate it before trying in daytime   Protonix 40 mg Take 30- 60 min before your first and last meals of the day  Please remember to go to  the  x-ray department downstairs in the basement  for your tests - we will call you with the results when they are available.  Please schedule a follow up office visit in 4 weeks, sooner if needed  - needs feno on return      11/14/2017   f/u ov/Kristi Huerta re: pred completed on 10/29/17 / cough started back end of March  2019  Chief Complaint  Patient presents with  . Follow-up    Cough is abouth the same. No new co's today.   Dyspnea:  Not limited by breathing from desired activities   Cough: gone completely until sev days prior to ov, did not take h1 as rec when started back up attributed to "allergies draining down my throat"  Sleep: fine night before ov but sometimes needs tramadol at hs due to cough since stopped pred  SABA use:  None  rec Please see patient coordinator before you leave today  to schedule Ct of chest in 2 weeks, no sooner  For drainage / throat tickle try take CHLORPHENIRAMINE  4 mg - take one every 4 hours as needed     12/12/2017  f/u ov/Kristi Huerta re:   Uacs/ bronchiectasis  On hrct  Chief Complaint  Patient presents with  . Follow-up    Cough is unchanged. No new co's.    Dyspnea:  Not limited by breathing from desired activities   Cough: no am mucus but constant datyimesense of pnds responds best to 1st gen H1 blockers per guidelines   Sleep: fine  SABA use:  None   No obvious day to day or daytime variability or assoc excess/ purulent sputum or mucus plugs or hemoptysis or cp or chest tightness, subjective wheeze or overt sinus or hb symptoms. No unusual exposure hx or h/o childhood pna/ asthma or knowledge of premature birth.  Sleeping  Fine flat  without nocturnal  or early am exacerbation  of respiratory  c/o's or need for noct saba. Also denies any obvious fluctuation of symptoms with weather or environmental changes or other aggravating or alleviating factors except as outlined above   Current Allergies, Complete Past Medical History, Past Surgical History, Family History, and Social History were reviewed in [employer] record.  ROS  The following are not active complaints unless bolded Hoarseness, sore throat, dysphagia, dental problems, itching, sneezing,  nasal congestion  or discharge of excess mucus or purulent secretions, ear ache,   fever, chills, sweats, unintended wt loss or wt gain, classically pleuritic or exertional cp,  orthopnea pnd or arm/hand swelling  or leg swelling, presyncope, palpitations, abdominal pain, anorexia, nausea, vomiting, diarrhea  or change in bowel habits or change in bladder habits, change in stools or change in urine, dysuria, hematuria,  rash, arthralgias, visual complaints, headache, numbness, weakness or ataxia or problems with walking or coordination,  change in mood or  memory.        Current Meds  Medication Sig  . aspirin 81 MG chewable tablet Chew 81 mg by mouth daily.  Marland Kitchen b complex vitamins tablet Take 1 tablet by mouth daily.  . chlorpheniramine (CHLOR-TRIMETON) 4 MG tablet Take 4 mg by mouth at bedtime. And every 4 hours as needed  . escitalopram (LEXAPRO) 10 MG tablet Take 10 mg by mouth daily.  . Garlic 2025 MG CAPS Take 1 capsule by mouth daily.  . hydrochlorothiazide (HYDRODIURIL) 25 MG tablet Take 25 mg by mouth daily.  . pantoprazole (PROTONIX) 40 MG tablet Take 30- 60  min before your first and last meals of the day  . pravastatin (PRAVACHOL) 20 MG tablet Take 1 tablet by mouth daily.  . traMADol (ULTRAM) 50 MG tablet 1-2 every 4 hours as needed for cough or pain  . verapamil (COVERA HS) 180 MG (CO) 24 hr tablet Take 180 mg by mouth at bedtime.  . vitamin C (ASCORBIC ACID) 500 MG tablet Take 500 mg by mouth daily.              Objective:   Physical Exam   amb wf   12/12/2017        134  10/17/2017          134  09/19/2017          132   11/26/12 127 lb 3.2 oz (57.698 kg)  10/29/12 130 lb (58.968 kg)  10/01/12 129 lb 9.6 oz (58.786 kg)       Vital signs reviewed - Note on arrival 02 sats  97% on RA      HEENT: nl dentition, turbinates bilaterally, and oropharynx. Nl external ear canals without cough reflex   NECK :  without JVD/Nodes/TM/ nl carotid upstrokes bilaterally   LUNGS: no acc muscle use,   Nl contour chest which is clear to A and P bilaterally with  cough on insp > exp maneuvers   CV:  RRR  no s3 or murmur or increase in P2, and no edema   ABD:  soft and nontender with nl inspiratory excursion in the supine position. No bruits or organomegaly appreciated, bowel sounds nl  MS:  Nl gait/ ext warm without deformities, calf tenderness, cyanosis  No clubbing No obvious joint restrictions   SKIN: warm and dry without lesions    NEURO:  alert, approp, nl sensorium with  no motor or cerebellar deficits apparent.       I personally reviewed images and agree with radiology impression as follows:   Chest HRCT  11/29/17 1. Scattered mild cylindrical and varicoid bronchiectasis with associated mild tree-in-bud opacities and irregular bandlike and nodular foci of consolidation in both lungs involving the basilar upper lobes, right middle lobe and medial right lower lobe. 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 2. Initial follow-up chest CT advised in 3 months given the dominant 1.1 cm subsolid nodular opacity in the medial right lower lobe, which is probably inflammatory.         Assessment & Plan:

## 2017-12-12 NOTE — Patient Instructions (Addendum)
No change in medications  Please see patient coordinator before you leave today  to schedule CT chest after July 21   Please schedule a follow up visit in 3 months but call sooner if needed with pfts same day   .

## 2017-12-13 ENCOUNTER — Encounter: Payer: Self-pay | Admitting: Internal Medicine

## 2017-12-13 NOTE — Assessment & Plan Note (Signed)
-   sinus CT 11/02/12 > neg - Allergy Profile 10/29/2012 >> IgE  12 pos grass/  Ragweed - Failed singulair > stopped 01/04/2013  > restart monotherapy with chlortrimeton - 10/17/2017 max rx for gerd and 12 days of pred s any cough med or h1 if truly "completely better just while on prednisone"  > pos response short lived  - CT sinus 11/04/17  Subtle opacification over the right frontal ethmoidal recess and anterior right ethmoid air cells. No air-fluid levels. Mastoid air cells are clear - FENO 11/14/2017  =   18 - HRCT 11/29/17 c/w mild bronchiectasis/ tree in bud    Despite ct findings the clinical picture is much more supportive of Upper airway cough syndrome (previously labeled PNDS),  is so named because it's frequently impossible to sort out how much is  CR/sinusitis with freq throat clearing (which can be related to primary GERD)   vs  causing  secondary (" extra esophageal")  GERD from wide swings in gastric pressure that occur with throat clearing, often  promoting self use of mint and menthol lozenges that reduce the lower esophageal sphincter tone and exacerbate the problem further in a cyclical fashion.   These are the same pts (now being labeled as having "irritable larynx syndrome" by some cough centers) who not infrequently have a history of having failed to tolerate ace inhibitors,  dry powder inhalers or biphosphonates or report having atypical/extraesophageal reflux symptoms that don't respond to standard doses of PPI  and are easily confused as having aecopd or asthma flares by even experienced allergists/ pulmonologists (myself included).   No change in rx needed for now

## 2017-12-13 NOTE — Assessment & Plan Note (Signed)
HRCT 11/28/17  1. Scattered mild cylindrical and varicoid bronchiectasis with associated mild tree-in-bud opacities and irregular bandlike and nodular foci of consolidation in both lungs involving the basilar upper lobes, right middle lobe and medial right lower lobe. 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 2. Initial follow-up chest CT advised in 3 months given the dominant 1.1 cm subsolid nodular opacity in the medial right lower lobe, which is probably inflammatory.> placed in reminder file for 02/28/18    .I had an extended discussion with the patient reviewing all relevant studies completed to date and  lasting 15 to 20 minutes of a 25 minute visit on the following ongoing concerns:   1) This is an extremely common benign condition in the elderly and does not warrant aggressive eval/ rx at this point unless there is a clinical correlation suggesting unaddressed pulmonary infection (purulent sputum, night sweats, unintended wt loss, doe) or evolution of  obvious changes on plain cxr (as opposed to serial CT, which is way over sensitive to make clinical decisions re intervention and treatment in the elderly, who tend to tolerate both dx and treatment poorly) .   2) symptoms don't correlate with findings nor does it explain why she appears to be "steroid responsive)   3) f/u ct in 3 months due to the dominant nodule in the RLL medially is approp give the other constellation of findings and with the fact PET scanning in this setting is very non-specific    Discussed in detail all the  indications, usual  risks and alternatives  relative to the benefits with patient who agrees to proceed with conservative f/u as outlined

## 2018-03-05 ENCOUNTER — Ambulatory Visit (INDEPENDENT_AMBULATORY_CARE_PROVIDER_SITE_OTHER)
Admission: RE | Admit: 2018-03-05 | Discharge: 2018-03-05 | Disposition: A | Discharge status: Home / Self Care | Payer: Medicare Other | Source: Ambulatory Visit | Attending: Internal Medicine | Admitting: Internal Medicine

## 2018-03-05 DIAGNOSIS — R059 Cough, unspecified: Secondary | ICD-10-CM

## 2018-03-05 DIAGNOSIS — R05 Cough: Secondary | ICD-10-CM

## 2018-03-05 DIAGNOSIS — J479 Bronchiectasis, uncomplicated: Secondary | ICD-10-CM

## 2018-03-06 NOTE — Progress Notes (Signed)
Spoke with pt and notified of results per Dr. Wert. Pt verbalized understanding and denied any questions. 

## 2018-03-13 ENCOUNTER — Encounter: Payer: Self-pay | Admitting: Internal Medicine

## 2018-03-13 ENCOUNTER — Ambulatory Visit (INDEPENDENT_AMBULATORY_CARE_PROVIDER_SITE_OTHER): Payer: Medicare Other | Admitting: Internal Medicine

## 2018-03-13 ENCOUNTER — Ambulatory Visit: Payer: Medicare Other | Admitting: Internal Medicine

## 2018-03-13 VITALS — BP 160/80 | HR 78 | Ht 65.0 in | Wt 135.0 lb

## 2018-03-13 DIAGNOSIS — R05 Cough: Secondary | ICD-10-CM

## 2018-03-13 DIAGNOSIS — J479 Bronchiectasis, uncomplicated: Secondary | ICD-10-CM

## 2018-03-13 DIAGNOSIS — R058 Other specified cough: Secondary | ICD-10-CM

## 2018-03-13 LAB — PULMONARY FUNCTION TEST
DL/VA % PRED: 84 %
DL/VA: 4.16 ml/min/mmHg/L
DLCO UNC % PRED: 74 %
DLCO unc: 19.06 ml/min/mmHg
FEF 25-75 POST: 2.34 L/s
FEF 25-75 PRE: 1.97 L/s
FEF2575-%Change-Post: 19 %
FEF2575-%PRED-PRE: 144 %
FEF2575-%Pred-Post: 172 %
FEV1-%Change-Post: 3 %
FEV1-%PRED-POST: 114 %
FEV1-%Pred-Pre: 111 %
FEV1-PRE: 2.18 L
FEV1-Post: 2.24 L
FEV1FVC-%CHANGE-POST: 1 %
FEV1FVC-%PRED-PRE: 107 %
FEV6-%CHANGE-POST: 1 %
FEV6-%Pred-Post: 111 %
FEV6-%Pred-Pre: 110 %
FEV6-POST: 2.78 L
FEV6-Pre: 2.75 L
FEV6FVC-%Change-Post: 0 %
FEV6FVC-%PRED-POST: 106 %
FEV6FVC-%Pred-Pre: 105 %
FVC-%Change-Post: 1 %
FVC-%Pred-Post: 105 %
FVC-%Pred-Pre: 104 %
FVC-PRE: 2.77 L
FVC-Post: 2.8 L
POST FEV6/FVC RATIO: 100 %
PRE FEV1/FVC RATIO: 79 %
Post FEV1/FVC ratio: 80 %
Pre FEV6/FVC Ratio: 100 %
RV % pred: 107 %
RV: 2.67 L
TLC % PRED: 102 %
TLC: 5.34 L

## 2018-03-13 NOTE — Progress Notes (Signed)
Subjective:   Patient ID: Kristi Huerta, female    DOB: 1934-02-27  MRN: [id number]    Brief patient profile:  55 yowf never smoker with h/o allergies = seasonal rhinitis spring  s asthma in 20's but outgrew it in her 61's  never cough or wheeze then onset cough April 2013 referred to pulmonary clinic 10/01/2012 by Dr Derrill Memo   History of Present Illness  10/01/2012 1st pulmonary eval cc daily cough abupt onset April 2013 and persistent since then esp at hs assoc with worse nasal symptoms intermittently but always the sensation of draingage even when no active nasal symptoms> never produces any mucus. Treated with clariton,  mucinex dm,  omeprazole, no change. Only better with hydrocodan then sleeps all night then recurs p stirring around for an hour daily.  rec First take delsym two tsp every 12 hours and supplement if needed with  tramadol 50 mg up to 2 every 4 hours to suppress the urge to cough.   Try protonix 40 mg Take 30-60 min before first meal of the day and Pepcid 20 mg one bedtime until  Return GERD  Diet  Prednisone 10 mg take  4 each am x 2 days,   2 each am x 2 days,  1 each am x2days and stop      01/04/2013 f/u ov/Kristi Huerta re cough worse on singulair and off chlortrimeton and acid supression ( did not follow inst) Chief Complaint  Patient presents with  . Follow-up    Pt states that cough is some less since the last visit. Mainly the cough is present at night and early am. Cough is non prod. She c/o sneezing and itchy, watery eyes for the past couple of wks.   rec Add back chlortrimeton 4 mg up to every 6 hours as needed and if not effective add back prilosec before bfast  and pepcid 20 mg at bedtime Stop singulair  Avoid perfumes and heavy scents      09/19/2017  ov/Kristi Huerta re: re establish re chronic cough - did not add back gerd rx as rec last ov as a contingency Chief Complaint  Patient presents with  . Pulmonary Consult    Referred by Dr. Mayra Neer. Pt has been  seen here in the past for cough, last in 2014. She states that her cough never went away and has been worse over the past year. It's esp worse at night and she has to sleep propped up on 2 pillows. She occ produces some yellow to green sputum that she relates to nasal drainage.   around a year prior to OV  Cough worsened while not on prilosec  / ? Better on  Chlortrimeton bid  Not bothered by sob with daily activities including housekeeping/ vacuuming  Tramadol helps the most Sense of post nasal drainage better now with H1 but only using bid  Kouffman Reflux v Neurogenic Cough Differentiator Reflux Comments  Do you awaken from a sound sleep coughing violently?                            With trouble breathing? Yes but not violent    Do you have choking episodes when you cannot  Get enough air, gasping for air ?              no   Do you usually cough when you lie down into  The bed, or when you just lie down  to rest ?                          No    Do you usually cough after meals or eating?         No    Do you cough when (or after) you bend over?    no   GERD SCORE     Kouffman Reflux v Neurogenic Cough Differentiator Neurogenic   Do you more-or-less cough all day long? sporadic   Does change of temperature make you cough? no   Does laughing or chuckling cause you to cough? no   Do fumes (perfume, automobile fumes, burned  Toast, etc.,) cause you to cough ?      maybe   Does speaking, singing, or talking on the phone cause you to cough   ?               Yes    Neurogenic/Airway score     rec Prednisone 10 mg take  4 each am x 2 days,   2 each am x 2 days,  1 each am x2days and stop  Pantoprazole (protonix) 40 mg   Take  30-60 min before first meal of the day and  Zantac 150 mg  one @  bedtime until return to office - this is the best way to tell whether stomach acid is contributing to your problem.   Take delsym two tsp every 12 hours and supplement if needed with  tramadol 50 mg up to 1  every 4 hours to suppress the urge to cough or even to clear your throat. Swallowing water or using ice chips/non mint and menthol containing candies (such as lifesavers or sugarless jolly ranchers) are also effective.  You should rest your voice and avoid activities that you know make you cough. Once you have eliminated the cough for 3 straight days try reducing the tramadol first,  then the delsym as tolerated.   For drainage / throat tickle try take CHLORPHENIRAMINE  4 mg - take one every 4 hours as needed -  Please schedule a follow up office visit in 4 weeks, sooner if needed  with all medications /inhalers/ solutions in hand so we can verify exactly what you are taking. This includes all medications from all doctors and over the counters     10/17/2017  f/u ov/Kristi Huerta re:  Cough  April 2013  Did not bring all meds as req  Chief Complaint  Patient presents with  . Follow-up    Cough has improved during the day but still keeps her awake at night.   Dyspnea:  Not a problem Cough: better daytime, worse at bedtime assoc with sense of pnds only took one h1 never tried 2  Sleep: interupted by cough even propped up on 2 pillows Prednisone eliminated the cough and the need for tramadol but still took the delsym  Overt hb resolved rec Prednisone Take 4 for three days 3 for three days 2 for three days 1 for three days and stop  For drainage / throat tickle try take CHLORPHENIRAMINE  4 mg - take one every 4 hours as needed - available over the counter- may cause drowsiness so start with just a bedtime dose or two and see how you tolerate it before trying in daytime   Protonix 40 mg Take 30- 60 min before your first and last meals of the day  Please remember to go to  the  x-ray department downstairs in the basement  for your tests - we will call you with the results when they are available.  Please schedule a follow up office visit in 4 weeks, sooner if needed  - needs feno on return      11/14/2017   f/u ov/Kristi Huerta re: pred completed on 10/29/17 / cough started back end of March  2019  Chief Complaint  Patient presents with  . Follow-up    Cough is abouth the same. No new co's today.   Dyspnea:  Not limited by breathing from desired activities   Cough: gone completely until sev days prior to ov, did not take h1 as rec when started back up attributed to "allergies draining down my throat"  Sleep: fine night before ov but sometimes needs tramadol at hs due to cough since stopped pred  SABA use:  None  rec Please see patient coordinator before you leave today  to schedule Ct of chest in 2 weeks, no sooner  For drainage / throat tickle try take CHLORPHENIRAMINE  4 mg - take one every 4 hours as needed     12/12/2017  f/u ov/Kristi Huerta re:   Uacs/ bronchiectasis  On hrct  Chief Complaint  Patient presents with  . Follow-up    Cough is unchanged. No new co's.    Dyspnea:  Not limited by breathing from desired activities   Cough: no am mucus but constant datyimesense of pnds responds best to 1st gen H1 blockers per guidelines   Sleep: fine  rec No change in medications Please see patient coordinator before you leave today  to schedule CT chest after July 21       03/13/2018  f/u ov/Kristi Huerta re: uacs/ bronchiectasis s airflow obst  Chief Complaint  Patient presents with  . Follow-up    Still coughing but not as bad as last ov. Cough gets worse at night.  Dyspnea:  Not limited by breathing from desired activities   Cough: worse at hs p stopping ppi ac   SABA use: none 02: none      No obvious day to day or daytime variability or assoc excess/ purulent sputum or mucus plugs or hemoptysis or cp or chest tightness, subjective wheeze or overt sinus or hb symptoms.   Sleeping: coughing everyother night at 30 degrees  without nocturnal  or early am exacerbation  of respiratory  c/o's or need for noct saba. Also denies any obvious fluctuation of symptoms with weather or environmental changes or  other aggravating or alleviating factors except as outlined above   No unusual exposure hx or h/o childhood pna/ asthma or knowledge of premature birth.  Current Allergies, Complete Past Medical History, Past Surgical History, Family History, and Social History were reviewed in [employer] [employer] record.  ROS  The following are not active complaints unless bolded Hoarseness, sore throat, dysphagia, dental problems, itching, sneezing,  nasal congestion or discharge of excess mucus or purulent secretions, ear ache,   fever, chills, sweats, unintended wt loss or wt gain, classically pleuritic or exertional cp,  orthopnea pnd or arm/hand swelling  or leg swelling, presyncope, palpitations, abdominal pain, anorexia, nausea, vomiting, diarrhea  or change in bowel habits or change in bladder habits, change in stools or change in urine, dysuria, hematuria,  rash, arthralgias, visual complaints, headache, numbness, weakness or ataxia or problems with walking or coordination,  change in mood or  memory.        Current Meds  Medication Sig  . aspirin 81 MG chewable tablet Chew 81 mg by mouth daily.  Marland Kitchen b complex vitamins tablet Take 1 tablet by mouth daily.  . chlorpheniramine (CHLOR-TRIMETON) 4 MG tablet Take 4 mg by mouth at bedtime. And every 4 hours as needed  . escitalopram (LEXAPRO) 10 MG tablet Take 10 mg by mouth daily.  . Garlic 0165 MG CAPS Take 1 capsule by mouth daily.  . hydrochlorothiazide (HYDRODIURIL) 25 MG tablet Take 25 mg by mouth daily.  . pantoprazole (PROTONIX) 40 MG tablet Take 30- 60 min before your first and last meals of the day  . pravastatin (PRAVACHOL) 20 MG tablet Take 1 tablet by mouth daily.  . traMADol (ULTRAM) 50 MG tablet 1-2 every 4 hours as needed for cough or pain  . verapamil (COVERA HS) 180 MG (CO) 24 hr tablet Take 180 mg by mouth at bedtime.  . vitamin C (ASCORBIC ACID) 500 MG tablet Take 500 mg by mouth daily.                        Objective:   Physical Exam  amb wf nad   03/13/2018        135  12/12/2017        134  10/17/2017          134  09/19/2017          132   11/26/12 127 lb 3.2 oz (57.698 kg)  10/29/12 130 lb (58.968 kg)  10/01/12 129 lb 9.6 oz (58.786 kg)       Vital signs reviewed - Note on arrival 02 sats  98% on RA       HEENT: nl dentition, turbinates bilaterally, and oropharynx. Nl external ear canals without cough reflex   NECK :  without JVD/Nodes/TM/ nl carotid upstrokes bilaterally   LUNGS: no acc muscle use,  Nl contour chest which is clear to A and P bilaterally without cough on insp or exp maneuvers   CV:  RRR  no s3 or murmur or increase in P2, and no edema   ABD:  soft and nontender with nl inspiratory excursion in the supine position. No bruits or organomegaly appreciated, bowel sounds nl  MS:  Nl gait/ ext warm without deformities, calf tenderness, cyanosis or clubbing No obvious joint restrictions   SKIN: warm and dry without lesions    NEURO:  alert, approp, nl sensorium with  no motor or cerebellar deficits apparent.               Assessment & Plan:

## 2018-03-13 NOTE — Patient Instructions (Addendum)
Add pepcid ac 20 mg and chlorpheniramine 4 mg 1- 2 tablets one hour before bed    Bronchiectasis =   you have scarring of your bronchial tubes which means that they don't function perfectly normally and mucus tends to pool in certain areas of your lung which can cause pneumonia and further scarring of your lung and bronchial tubes  Whenever you develop cough congestion take mucinex or mucinex dm > these will help keep the mucus loose and flowing but if your condition worsens you need to seek help immediately preferably here or somewhere inside the [facility] system to compare xrays ( worse = darker or bloody mucus or pain on breathing in)     Please schedule a follow up visit in 6  months but call sooner if needed

## 2018-03-13 NOTE — Progress Notes (Signed)
PFT done today. 

## 2018-03-17 ENCOUNTER — Encounter: Payer: Self-pay | Admitting: Internal Medicine

## 2018-03-17 NOTE — Assessment & Plan Note (Addendum)
-   sinus CT 11/02/12 > neg - Allergy Profile 10/29/2012 >> IgE  12 pos grass/  Ragweed - Failed singulair > stopped 01/04/2013  > restart monotherapy with chlortrimeton - 10/17/2017 max rx for gerd and 12 days of pred s any cough med or h1 if truly "completely better just while on prednisone"  > pos response short lived  - CT sinus 11/04/17  Subtle opacification over the right frontal ethmoidal recess and anterior right ethmoid air cells. No air-fluid levels. Mastoid air cells are clear - FENO 11/14/2017  =   18  - trial of pepcid and hs h1 03/13/2018 >>>   Upper airway cough syndrome (previously labeled PNDS),  is so named because it's frequently impossible to sort out how much is  CR/sinusitis with freq throat clearing (which can be related to primary GERD)   vs  causing  secondary (" extra esophageal")  GERD from wide swings in gastric pressure that occur with throat clearing, often  promoting self use of mint and menthol lozenges that reduce the lower esophageal sphincter tone and exacerbate the problem further in a cyclical fashion.   These are the same pts (now being labeled as having "irritable larynx syndrome" by some cough centers) who not infrequently have a history of having failed to tolerate ace inhibitors,  dry powder inhalers or biphosphonates or report having atypical/extraesophageal reflux symptoms that don't respond to standard doses of PPI  and are easily confused as having aecopd or asthma flares by even experienced allergists/ pulmonologists (myself included).   Will try h1 and h2  Blockers at hs focusing on her noct complaints for now  F/uq 6  m, sooner if needed

## 2018-03-17 NOTE — Assessment & Plan Note (Signed)
HRCT 11/28/17  1. Scattered mild cylindrical and varicoid bronchiectasis with associated mild tree-in-bud opacities and irregular bandlike and nodular foci of consolidation in both lungs involving the basilar upper lobes, right middle lobe and medial right lower lobe. 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 2. Initial follow-up chest CT advised in 3 months given the dominant 1.1 cm subsolid nodular opacity in the medial right lower lobe, which is probably inflammatory - CT chest 03/06/2018 1. The medial right lower lobe pulmonary nodule of concern has substantially decreased in size, compatible with a resolving inflammatory nodule. 2. Stable mild cylindrical and varicoid bronchiectasis in both lungs, most prominent in the medial right middle lobe and basilar right upper lobe. Associated evolving foci of subpleural postinfectious scarring in the medial lungs. No acute pulmonary disease.   - PFT's  03/13/2018  FEV1 2.24 (114 % ) ratio 80  p 3 % improvement from saba p nothing prior to study with DLCO  74 % corrects to 84  % for alv volume     Reviewed pathophysiology of bronchiectasis using escalator analogy/ don't feel this is really contributing much to her cough at hs which is more likely uacs (see separate a/p)

## 2018-05-15 ENCOUNTER — Telehealth: Payer: Self-pay | Admitting: Internal Medicine

## 2018-05-15 ENCOUNTER — Other Ambulatory Visit: Payer: Self-pay | Admitting: Internal Medicine

## 2018-05-15 DIAGNOSIS — R059 Cough, unspecified: Secondary | ICD-10-CM

## 2018-05-15 DIAGNOSIS — R05 Cough: Secondary | ICD-10-CM

## 2018-05-15 NOTE — Telephone Encounter (Signed)
Take chlorpheniramine x 2 one hour prio to hs and make appt to see me with all active meds in hand including otcs

## 2018-05-15 NOTE — Telephone Encounter (Signed)
Called and spoke with patient, she states that she has been taking the chlorotab 4 times a day for months now. She has not stopped coughing. The cough gets worse at night to where she cannot sleep. It causes her to gag. She is wanting to know what she can do.   MW please advise, thank you.

## 2018-05-15 NOTE — Telephone Encounter (Signed)
Called spoke with patient.  She verbalized and understanding of MW instructions. Appointment made for tomorrow afternoon with all medications.  Nothing further needed.

## 2018-05-16 ENCOUNTER — Ambulatory Visit: Payer: Medicare Other | Admitting: Internal Medicine

## 2018-05-16 ENCOUNTER — Encounter: Payer: Self-pay | Admitting: Internal Medicine

## 2018-05-16 ENCOUNTER — Other Ambulatory Visit (INDEPENDENT_AMBULATORY_CARE_PROVIDER_SITE_OTHER): Payer: Medicare Other

## 2018-05-16 VITALS — BP 122/80 | HR 77 | Ht 64.5 in | Wt 136.0 lb

## 2018-05-16 DIAGNOSIS — R058 Other specified cough: Secondary | ICD-10-CM

## 2018-05-16 DIAGNOSIS — R05 Cough: Secondary | ICD-10-CM

## 2018-05-16 DIAGNOSIS — R059 Cough, unspecified: Secondary | ICD-10-CM

## 2018-05-16 DIAGNOSIS — J479 Bronchiectasis, uncomplicated: Secondary | ICD-10-CM

## 2018-05-16 LAB — CBC WITH DIFFERENTIAL/PLATELET
BASOS PCT: 0.6 % (ref 0.0–3.0)
Basophils Absolute: 0 10*3/uL (ref 0.0–0.1)
EOS PCT: 2.8 % (ref 0.0–5.0)
Eosinophils Absolute: 0.2 10*3/uL (ref 0.0–0.7)
HEMATOCRIT: 37.5 % (ref 36.0–46.0)
HEMOGLOBIN: 12.8 g/dL (ref 12.0–15.0)
LYMPHS PCT: 18.4 % (ref 12.0–46.0)
Lymphs Abs: 1.2 10*3/uL (ref 0.7–4.0)
MCHC: 34.1 g/dL (ref 30.0–36.0)
MCV: 93.6 fl (ref 78.0–100.0)
Monocytes Absolute: 0.7 10*3/uL (ref 0.1–1.0)
Monocytes Relative: 10.9 % (ref 3.0–12.0)
Neutro Abs: 4.5 10*3/uL (ref 1.4–7.7)
Neutrophils Relative %: 67.3 % (ref 43.0–77.0)
Platelets: 245 10*3/uL (ref 150.0–400.0)
RBC: 4.01 Mil/uL (ref 3.87–5.11)
RDW: 12.8 % (ref 11.5–15.5)
WBC: 6.7 10*3/uL (ref 4.0–10.5)

## 2018-05-16 MED ORDER — TRAMADOL HCL 50 MG PO TABS: ORAL_TABLET | ORAL | 0 refills | Status: AC

## 2018-05-16 MED ORDER — GABAPENTIN 100 MG PO CAPS: 100.0000 mg | ORAL_CAPSULE | Freq: Three times a day (TID) | ORAL | 2 refills | Status: DC

## 2018-05-16 MED ORDER — PREDNISONE 10 MG PO TABS: ORAL_TABLET | ORAL | 0 refills | Status: DC

## 2018-05-16 NOTE — Patient Instructions (Addendum)
Continue the pepcid 20 mg and chlorpheniramine 4 mg x 2 and take one hour before bedtime  Gabapentin 100 mg three times a day until return   Add mucinex dm up to 600 mg x 2 every 12 hours  If can't stop coughing then add tramadol 50 mg one up to every 4 hours   Continue protonix Take 30-60 min before first meal of the day   Prednisone 10 mg take  4 each am x 2 days,   2 each am x 2 days,  1 each am x 2 days and stop    GERD (REFLUX)  is an extremely common cause of respiratory symptoms just like yours , many times with no obvious heartburn at all.    It can be treated with medication, but also with lifestyle changes including elevation of the head of your bed (ideally with 6 inch  bed blocks),  Smoking cessation, avoidance of late meals, excessive alcohol, and avoid fatty foods, chocolate, peppermint, colas, red wine, and acidic juices such as orange juice.  NO MINT OR MENTHOL PRODUCTS SO NO COUGH DROPS   USE SUGARLESS CANDY INSTEAD (Jolley ranchers or Stover's or Life Savers) or even ice chips will also do - the key is to swallow to prevent all throat clearing. NO OIL BASED VITAMINS - use powdered substitutes.   Please schedule a follow up office visit in 4 weeks, call sooner if needed with all medications /inhalers/ solutions in hand so we can verify exactly what you are taking. This includes all medications from all doctors and over the [place] separate them into two bags:  the ones you take automatically, no matter what, vs the ones you take just when you feel you need them "BAG #2 is UP TO YOU"  - this will really help [place] help you take your medications more effectively.

## 2018-05-16 NOTE — Progress Notes (Signed)
Subjective:   Patient ID: Kristi Huerta, female    DOB: 01-27-1934  MRN: [id number]    Brief patient profile:  12 yowf never smoker with h/o allergies = seasonal rhinitis spring onset in her 63s  s any h/o asthma   but outgrew it in her 31's  never cough or wheeze then onset cough April 2013 referred to pulmonary clinic 10/01/2012 by Dr Derrill Memo   History of Present Illness  10/01/2012 1st pulmonary eval cc daily cough abupt onset April 2013 and persistent since then esp at hs assoc with worse nasal symptoms intermittently but always the sensation of draingage even when no active nasal symptoms> never produces any mucus. Treated with clariton,  mucinex dm,  omeprazole, no change. Only better with hydrocodan then sleeps all night then recurs p stirring around for an hour daily.  rec First take delsym two tsp every 12 hours and supplement if needed with  tramadol 50 mg up to 2 every 4 hours to suppress the urge to cough.   Try protonix 40 mg Take 30-60 min before first meal of the day and Pepcid 20 mg one bedtime until  Return GERD  Diet  Prednisone 10 mg take  4 each am x 2 days,   2 each am x 2 days,  1 each am x2days and stop      01/04/2013 f/u ov/[name] re cough worse on singulair and off chlortrimeton and acid supression ( did not follow inst) Chief Complaint  Patient presents with  . Follow-up    Pt states that cough is some less since the last visit. Mainly the cough is present at night and early am. Cough is non prod. She c/o sneezing and itchy, watery eyes for the past couple of wks.   rec Add back chlortrimeton 4 mg up to every 6 hours as needed and if not effective add back prilosec before bfast  and pepcid 20 mg at bedtime Stop singulair  Avoid perfumes and heavy scents      09/19/2017  ov/[name] re: re establish re chronic cough - did not add back gerd rx as rec last ov as a contingency Chief Complaint  Patient presents with  . Pulmonary Consult    Referred by Dr. Mayra Neer. Pt has been seen here in the past for cough, last in 2014. She states that her cough never went away and has been worse over the past year. It's esp worse at night and she has to sleep propped up on 2 pillows. She occ produces some yellow to green sputum that she relates to nasal drainage.   around a year prior to OV  Cough worsened while not on prilosec  / ? Better on  Chlortrimeton bid  Not bothered by sob with daily activities including housekeeping/ vacuuming  Tramadol helps the most Sense of post nasal drainage better now with H1 but only using bid  Kouffman Reflux v Neurogenic Cough Differentiator Reflux Comments  Do you awaken from a sound sleep coughing violently?                            With trouble breathing? Yes but not violent    Do you have choking episodes when you cannot  Get enough air, gasping for air ?              no   Do you usually cough when you lie down into  The bed,  or when you just lie down to rest ?                          No    Do you usually cough after meals or eating?         No    Do you cough when (or after) you bend over?    no   GERD SCORE     Kouffman Reflux v Neurogenic Cough Differentiator Neurogenic   Do you more-or-less cough all day long? sporadic   Does change of temperature make you cough? no   Does laughing or chuckling cause you to cough? no   Do fumes (perfume, automobile fumes, burned  Toast, etc.,) cause you to cough ?      maybe   Does speaking, singing, or talking on the phone cause you to cough   ?               Yes    Neurogenic/Airway score     rec Prednisone 10 mg take  4 each am x 2 days,   2 each am x 2 days,  1 each am x2days and stop  Pantoprazole (protonix) 40 mg   Take  30-60 min before first meal of the day and  Zantac 150 mg  one @  bedtime until return to office - this is the best way to tell whether stomach acid is contributing to your problem.   Take delsym two tsp every 12 hours and supplement if needed with   tramadol 50 mg up to 1 every 4 hours to suppress the urge to cough or even to clear your throat. Swallowing water or using ice chips/non mint and menthol containing candies (such as lifesavers or sugarless jolly ranchers) are also effective.  You should rest your voice and avoid activities that you know make you cough. Once you have eliminated the cough for 3 straight days try reducing the tramadol first,  then the delsym as tolerated.   For drainage / throat tickle try take CHLORPHENIRAMINE  4 mg - take one every 4 hours as needed -  Please schedule a follow up office visit in 4 weeks, sooner if needed  with all medications /inhalers/ solutions in hand so we can verify exactly what you are taking. This includes all medications from all doctors and over the counters     10/17/2017  f/u ov/[name] re:  Cough  April 2013  Did not bring all meds as req  Chief Complaint  Patient presents with  . Follow-up    Cough has improved during the day but still keeps her awake at night.   Dyspnea:  Not a problem Cough: better daytime, worse at bedtime assoc with sense of pnds only took one h1 never tried 2  Sleep: interupted by cough even propped up on 2 pillows Prednisone eliminated the cough and the need for tramadol but still took the delsym  Overt hb resolved rec Prednisone Take 4 for three days 3 for three days 2 for three days 1 for three days and stop  For drainage / throat tickle try take CHLORPHENIRAMINE  4 mg - take one every 4 hours as needed - available over the counter- may cause drowsiness so start with just a bedtime dose or two and see how you tolerate it before trying in daytime   Protonix 40 mg Take 30- 60 min before your first and last meals of the day  Please remember to go to the  x-ray department downstairs in the basement  for your tests - we will call you with the results when they are available.  Please schedule a follow up office visit in 4 weeks, sooner if needed  - needs feno on  return      11/14/2017  f/u ov/[name] re: pred completed on 10/29/17 / cough started back end of March  2019  Chief Complaint  Patient presents with  . Follow-up    Cough is abouth the same. No new co's today.   Dyspnea:  Not limited by breathing from desired activities   Cough: gone completely until sev days prior to ov, did not take h1 as rec when started back up attributed to "allergies draining down my throat"  Sleep: fine night before ov but sometimes needs tramadol at hs due to cough since stopped pred  SABA use:  None  rec Please see patient coordinator before you leave today  to schedule Ct of chest in 2 weeks, no sooner  For drainage / throat tickle try take CHLORPHENIRAMINE  4 mg - take one every 4 hours as needed          03/13/2018  f/u ov/Zalea Pete re: uacs/ bronchiectasis s airflow obst  Chief Complaint  Patient presents with  . Follow-up    Still coughing but not as bad as last ov. Cough gets worse at night.  Dyspnea:  Not limited by breathing from desired activities   Cough: worse at hs p stopping ppi ac  SABA use: none 02: none   rec Add pepcid ac 20 mg and chlorpheniramine 4 mg 1- 2 tablets one hour before bed  Bronchiectasis  Discussion     05/16/2018  f/u ov/Rebbecca Osuna re: uacs/bronchiectasis with cough x 2013 with h/o bad reflux on fosfamax / pos resp to prednisone  Chief Complaint  Patient presents with  . Follow-up    Cough is about the same. Some nights it keeps her awake all night. She rarely produces sputum but when she does it is clear.   Dyspnea:  Not limited by breathing from desired activities   Cough: never took mucinex dm up to 600 every 12 hours  Sleeping: at bedtime cough 5 min then 2 hours later coughing starts  SABA use: none  02: none   No obvious day to day or daytime variability or assoc purulent sputum or mucus plugs or hemoptysis or cp or chest tightness, subjective wheeze or overt sinus or hb symptoms.    . Also denies any obvious  fluctuation of symptoms with weather or environmental changes or other aggravating or alleviating factors except as outlined above   No unusual exposure hx or h/o childhood pna/ asthma or knowledge of premature birth.  Current Allergies, Complete Past Medical History, Past Surgical History, Family History, and Social History were reviewed in [employer] record.  ROS  The following are not active complaints unless bolded Hoarseness, sore throat, dysphagia, dental problems, itching, sneezing,  nasal congestion or discharge of excess mucus or purulent secretions, ear ache,   fever, chills, sweats, unintended wt loss or wt gain, classically pleuritic or exertional cp,  orthopnea pnd or arm/hand swelling  or leg swelling, presyncope, palpitations, abdominal pain, anorexia, nausea, vomiting, diarrhea  or change in bowel habits or change in bladder habits, change in stools or change in urine, dysuria, hematuria,  rash, arthralgias, visual complaints, headache, numbness, weakness or ataxia or problems with walking or coordination,  change in mood or  memory.        Current Meds  Medication Sig  . acetaminophen (TYLENOL) 325 MG tablet Take 650 mg by mouth every 6 (six) hours as needed.  Marland Kitchen aspirin 81 MG chewable tablet Chew 81 mg by mouth daily.  Marland Kitchen b complex vitamins tablet Take 1 tablet by mouth daily.  . calcium citrate-vitamin D (CITRACAL+D) 315-200 MG-UNIT tablet Take 2 tablets by mouth 2 (two) times daily.  . chlorpheniramine (CHLOR-TRIMETON) 4 MG tablet Take 4 mg by mouth at bedtime. And every 4 hours as needed  . dextromethorphan-guaiFENesin (MUCINEX DM) 30-600 MG 12hr tablet Take 2 tablets by mouth 2 (two) times daily as needed for cough.  . escitalopram (LEXAPRO) 10 MG tablet Take 10 mg by mouth daily.  . famotidine (PEPCID) 20 MG tablet Take 20 mg by mouth at bedtime.  . Garlic 7824 MG CAPS Take 1 capsule by mouth daily.  . hydrochlorothiazide (HYDRODIURIL) 25 MG tablet  Take 25 mg by mouth daily.  . pantoprazole (PROTONIX) 40 MG tablet TAKE 1 TABLET BY MOUTH DAILY(TAKE 30 TO 60 MINUTES BEFORE FIRST MEAL OF THE DAY)  . pravastatin (PRAVACHOL) 20 MG tablet Take 1 tablet by mouth daily.  . traMADol (ULTRAM) 50 MG tablet 1-2 every 4 hours as needed for cough or pain  . verapamil (COVERA HS) 180 MG (CO) 24 hr tablet Take 180 mg by mouth at bedtime.  . [  traMADol (ULTRAM) 50 MG tablet 1-2 every 4 hours as needed for cough or pain                           Objective:   Physical Exam  amb wf sniffling and clearing throat with cough on insp  05/16/2018        136  03/13/2018        135  12/12/2017        134  10/17/2017          134  09/19/2017          132   11/26/12 127 lb 3.2 oz (57.698 kg)  10/29/12 130 lb (58.968 kg)  10/01/12 129 lb 9.6 oz (58.786 kg)    Vital signs reviewed - Note on arrival 02 sats  97% on RA     HEENT: nl dentition, turbinates bilaterally, and oropharynx. Nl external ear canals without cough reflex   NECK :  without JVD/Nodes/TM/ nl carotid upstrokes bilaterally   LUNGS: no acc muscle use,  Nl contour chest which is clear to A and P bilaterally without cough on insp or exp maneuvers   CV:  RRR  no s3 or murmur or increase in P2, and no edema   ABD:  soft and nontender with nl inspiratory excursion in the supine position. No bruits or organomegaly appreciated, bowel sounds nl  MS:  Nl gait/ ext warm without deformities, calf tenderness, cyanosis or clubbing No obvious joint restrictions   SKIN: warm and dry without lesions    NEURO:  alert, approp, nl sensorium with  no motor or cerebellar deficits apparent.               Assessment & Plan:

## 2018-05-17 ENCOUNTER — Encounter: Payer: Self-pay | Admitting: Internal Medicine

## 2018-05-17 LAB — RESPIRATORY ALLERGY PROFILE REGION II ~~LOC~~
Allergen, A. alternata, m6: <0.1 kU/L
Allergen, Cedar tree, t12: <0.1 kU/L
Allergen, D pternoyssinus,d7: <0.1 kU/L
Allergen, Mouse Urine Protein, e78: <0.1 kU/L
Allergen, Mulberry, t76: <0.1 kU/L
Allergen, Oak,t7: <0.1 kU/L
Allergen, P. notatum, m1: <0.1 kU/L
Aspergillus fumigatus, m3: <0.1 kU/L
CLASS: 0
CLASS: 0
CLASS: 0
CLASS: 0
CLASS: 0
CLASS: 0
CLASS: 0
CLASS: 0
CLASS: 1
Cat Dander: <0.1 kU/L
Class: 0
Class: 0
Class: 0
Class: 0
Class: 0
Class: 0
Class: 0
Class: 0
Class: 0
Class: 0
Class: 0
Class: 0
Class: 0
Class: 0
Class: 0
Cockroach: <0.1 kU/L
D. farinae: <0.1 kU/L
IgE (Immunoglobulin E), Serum: 11 kU/L (ref <=114)
Rough Pigweed  IgE: <0.1 kU/L
Timothy Grass: 0.46 kU/L — ABNORMAL HIGH

## 2018-05-17 LAB — INTERPRETATION:

## 2018-05-17 NOTE — Assessment & Plan Note (Signed)
- sinus CT 11/02/12 > neg - Allergy Profile 10/29/2012 >> IgE  12 pos grass/  Ragweed - Failed singulair > stopped 01/04/2013  > restart monotherapy with chlortrimeton - 10/17/2017 max rx for gerd and 12 days of pred s any cough med or h1 if truly "completely better just while on prednisone"  > pos response short lived  - CT sinus 11/04/17  Subtle opacification over the right frontal ethmoidal recess and anterior right ethmoid air cells. No air-fluid levels. Mastoid air cells are clear - FENO 11/14/2017  =   18 - HRCT 11/29/17 c/w mild bronchiectasis - see bronchiectasis - trial of pepcid and hs h1 03/13/2018 >>> no better  - add gabapentin 100 mg 05/16/2018   Cough is likely multifactorial with at least a component related to poorly controlled rhinitis with pnds and ragweed allergy though not she says it is not just with the fall season but does seem better with pred rx.   Upper airway cough syndrome (previously labeled PNDS),  is so named because it's frequently impossible to sort out how much is  CR/sinusitis with freq throat clearing (which can be related to primary GERD)   vs  causing  secondary (" extra esophageal")  GERD from wide swings in gastric pressure that occur with throat clearing, often  promoting self use of mint and menthol lozenges that reduce the lower esophageal sphincter tone and exacerbate the problem further in a cyclical fashion.   These are the same pts (now being labeled as having "irritable larynx syndrome" by some cough centers) who not infrequently have a history of having failed to tolerate ace inhibitors,  dry powder inhalers or biphosphonates or report having atypical/extraesophageal reflux symptoms that don't respond to standard doses of PPI  and are easily confused as having aecopd or asthma flares by even experienced allergists/ pulmonologists (myself included).   Of the three most common causes of  Sub-acute / recurrent or chronic cough, only one (GERD)  can actually  contribute to/ trigger  the other two (asthma and post nasal drip syndrome)  and perpetuate the cylce of cough.  While not intuitively obvious, many patients with chronic low grade reflux do not cough until there is a primary insult that disturbs the protective epithelial barrier and exposes sensitive nerve endings.   This is typically viral but can due to PNDS and  either may apply here.   The point is that once this occurs, it is difficult to eliminate the cycle  using anything but a maximally effective acid suppression regimen at least in the short run, accompanied by an appropriate diet to address non acid GERD and control / eliminate the cough itself for at least 3 days with tramadol.   For irritable larynx rec a trial of low dose gabapentin For cyclical cough rec tramadol short term For ? Allergic rhinitis rec pred x 6 days   Return in 4 weeks for med reconciliation.  To keep things simple, I have asked the patient to first separate medicines that are perceived as maintenance, that is to be taken daily "no matter what", from those medicines that are taken on only on an as-needed basis and I have given the patient examples of both, and then return to see me with all meds in hand using a trust but verify approach to confirm accurate Medication  Reconciliation The principal here is that until we are certain that the  patients are doing what we've asked, it makes no sense to ask  them to do more.    I had an extended discussion with the patient reviewing all relevant studies completed to date and  lasting 15 to 20 minutes of a 25 minute visit    Each maintenance medication was reviewed in detail including most importantly the difference between maintenance and prns and under what circumstances the prns are to be triggered using an action plan format that is not reflected in the computer generated alphabetically organized AVS.     Please see AVS for specific instructions unique to this visit that I  personally wrote and verbalized to the the pt in detail and then reviewed with pt  by my nurse highlighting any  changes in therapy recommended at today's visit to their plan of care.

## 2018-05-17 NOTE — Assessment & Plan Note (Signed)
HRCT 11/28/17  1. Scattered mild cylindrical and varicoid bronchiectasis with associated mild tree-in-bud opacities and irregular bandlike and nodular foci of consolidation in both lungs involving the basilar upper lobes, right middle lobe and medial right lower lobe. 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 2. Initial follow-up chest CT advised in 3 months given the dominant 1.1 cm subsolid nodular opacity in the medial right lower lobe, which is probably inflammatory - CT chest 03/06/2018 1. The medial right lower lobe pulmonary nodule of concern has substantially decreased in size, compatible with a resolving inflammatory nodule. 2. Stable mild cylindrical and varicoid bronchiectasis in both lungs, most prominent in the medial right middle lobe and basilar right upper lobe. Associated evolving foci of subpleural postinfectious scarring in the medial lungs.   - PFT's  03/13/2018  FEV1 2.24 (114 % ) ratio 80  p 3 % improvement from saba p nothing prior to study with DLCO  74 % corrects to 84  % for alv volume    Nothing in hx to suggest this is the active cause for the cough but will likely serve as a trigger for cyclical / upper airway coughing when flares and may consider low dose ics/laba if she does reproducibly improve with short course of prednisone.

## 2018-05-18 NOTE — Progress Notes (Signed)
Spoke with pt and notified of results per Dr. Wert. Pt verbalized understanding and denied any questions. 

## 2018-06-12 ENCOUNTER — Other Ambulatory Visit: Payer: Self-pay | Admitting: Internal Medicine

## 2018-06-12 DIAGNOSIS — R059 Cough, unspecified: Secondary | ICD-10-CM

## 2018-06-12 DIAGNOSIS — R05 Cough: Secondary | ICD-10-CM

## 2018-06-14 ENCOUNTER — Ambulatory Visit: Payer: Medicare Other | Admitting: Internal Medicine

## 2018-06-14 ENCOUNTER — Encounter: Payer: Self-pay | Admitting: Internal Medicine

## 2018-06-14 VITALS — BP 138/70 | HR 74 | Ht 64.5 in | Wt 137.0 lb

## 2018-06-14 DIAGNOSIS — R05 Cough: Secondary | ICD-10-CM | POA: Diagnosis not present

## 2018-06-14 DIAGNOSIS — J479 Bronchiectasis, uncomplicated: Secondary | ICD-10-CM

## 2018-06-14 DIAGNOSIS — R058 Other specified cough: Secondary | ICD-10-CM

## 2018-06-14 MED ORDER — GABAPENTIN 100 MG PO CAPS: 100.0000 mg | ORAL_CAPSULE | Freq: Four times a day (QID) | ORAL | 2 refills | Status: AC

## 2018-06-14 NOTE — Progress Notes (Signed)
Subjective:   Patient ID: Kristi Huerta, female    DOB: [birth year]  MRN:  [id number]    Brief patient profile:  82 yowf never smoker with h/o allergies = seasonal rhinitis spring onset in her 25s  s any h/o asthma   but outgrew it in her 16's  never cough or wheeze then onset cough April 2013 referred to pulmonary clinic 10/01/2012 by Dr Derrill Memo   History of Present Illness  10/01/2012 1st pulmonary eval cc daily cough abupt onset April 2013 and persistent since then esp at hs assoc with worse nasal symptoms intermittently but always the sensation of draingage even when no active nasal symptoms> never produces any mucus. Treated with clariton,  mucinex dm,  omeprazole, no change. Only better with hydrocodan then sleeps all night then recurs p stirring around for an hour daily.  rec First take delsym two tsp every 12 hours and supplement if needed with  tramadol 50 mg up to 2 every 4 hours to suppress the urge to cough.   Try protonix 40 mg Take 30-60 min before first meal of the day and Pepcid 20 mg one bedtime until  Return GERD  Diet  Prednisone 10 mg take  4 each am x 2 days,   2 each am x 2 days,  1 each am x2days and stop      01/04/2013 f/u ov/Kristi Huerta re cough worse on singulair and off chlortrimeton and acid supression ( did not follow inst) Chief Complaint  Patient presents with  . Follow-up    Pt states that cough is some less since the last visit. Mainly the cough is present at night and early am. Cough is non prod. She c/o sneezing and itchy, watery eyes for the past couple of wks.   rec Add back chlortrimeton 4 mg up to every 6 hours as needed and if not effective add back prilosec before bfast  and pepcid 20 mg at bedtime Stop singulair  Avoid perfumes and heavy scents      09/19/2017  ov/Kristi Huerta re: re establish re chronic cough - did not add back gerd rx as rec last ov as a contingency Chief Complaint  Patient presents with  . Pulmonary Consult    Referred by Dr. Mayra Huerta. Pt has been seen here in the past for cough, last in 2014. She states that her cough never went away and has been worse over the past year. It's esp worse at night and she has to sleep propped up on 2 pillows. She occ produces some yellow to green sputum that she relates to nasal drainage.   around a year prior to OV  Cough worsened while not on prilosec  / ? Better on  Chlortrimeton bid  Not bothered by sob with daily activities including housekeeping/ vacuuming  Tramadol helps the most Sense of post nasal drainage better now with H1 but only using bid  Kouffman Reflux v Neurogenic Cough Differentiator Reflux Comments  Do you awaken from a sound sleep coughing violently?                            With trouble breathing? Yes but not violent    Do you have choking episodes when you cannot  Get enough air, gasping for air ?              no   Do you usually cough when you lie down into  The  bed, or when you just lie down to rest ?                          No    Do you usually cough after meals or eating?         No    Do you cough when (or after) you bend over?    no   GERD SCORE     Kouffman Reflux v Neurogenic Cough Differentiator Neurogenic   Do you more-or-less cough all day long? sporadic   Does change of temperature make you cough? no   Does laughing or chuckling cause you to cough? no   Do fumes (perfume, automobile fumes, burned  Toast, etc.,) cause you to cough ?      maybe   Does speaking, singing, or talking on the phone cause you to cough   ?               Yes    Neurogenic/Airway score     rec Prednisone 10 mg take  4 each am x 2 days,   2 each am x 2 days,  1 each am x2days and stop  Pantoprazole (protonix) 40 mg   Take  30-60 min before first meal of the day and  Zantac 150 mg  one @  bedtime until return to office - this is the best way to tell whether stomach acid is contributing to your problem.   Take delsym two tsp every 12 hours and supplement if needed with   tramadol 50 mg up to 1 every 4 hours to suppress the urge to cough or even to clear your throat. Swallowing water or using ice chips/non mint and menthol containing candies (such as lifesavers or sugarless jolly ranchers) are also effective.  You should rest your voice and avoid activities that you know make you cough. Once you have eliminated the cough for 3 straight days try reducing the tramadol first,  then the delsym as tolerated.   For drainage / throat tickle try take CHLORPHENIRAMINE  4 mg - take one every 4 hours as needed -  Please schedule a follow up office visit in 4 weeks, sooner if needed  with all medications /inhalers/ solutions in hand so we can verify exactly what you are taking. This includes all medications from all doctors and over the counters     10/17/2017  f/u ov/Kristi Huerta re:  Cough  April 2013  Did not bring all meds as req  Chief Complaint  Patient presents with  . Follow-up    Cough has improved during the day but still keeps her awake at night.   Dyspnea:  Not a problem Cough: better daytime, worse at bedtime assoc with sense of pnds only took one h1 never tried 2  Sleep: interupted by cough even propped up on 2 pillows Prednisone eliminated the cough and the need for tramadol but still took the delsym  Overt hb resolved rec Prednisone Take 4 for three days 3 for three days 2 for three days 1 for three days and stop  For drainage / throat tickle try take CHLORPHENIRAMINE  4 mg - take one every 4 hours as needed - available over the counter- may cause drowsiness so start with just a bedtime dose or two and see how you tolerate it before trying in daytime   Protonix 40 mg Take 30- 60 min before your first and last meals of the  day  Please remember to go to the  x-ray department downstairs in the basement  for your tests - we will call you with the results when they are available.  Please schedule a follow up office visit in 4 weeks, sooner if needed  - needs feno on  return      11/14/2017  f/u ov/Kristi Huerta re: pred completed on 10/29/17 / cough started back end of March  2019  Chief Complaint  Patient presents with  . Follow-up    Cough is abouth the same. No new co's today.   Dyspnea:  Not limited by breathing from desired activities   Cough: gone completely until sev days prior to ov, did not take h1 as rec when started back up attributed to "allergies draining down my throat"  Sleep: fine night before ov but sometimes needs tramadol at hs due to cough since stopped pred  SABA use:  None  rec Please see patient coordinator before you leave today  to schedule Ct of chest in 2 weeks, no sooner  For drainage / throat tickle try take CHLORPHENIRAMINE  4 mg - take one every 4 hours as needed          03/13/2018  f/u ov/Joselynne Killam re: uacs/ bronchiectasis s airflow obst  Chief Complaint  Patient presents with  . Follow-up    Still coughing but not as bad as last ov. Cough gets worse at night.  Dyspnea:  Not limited by breathing from desired activities   Cough: worse at hs p stopping ppi ac  SABA use: none 02: none   rec Add pepcid ac 20 mg and chlorpheniramine 4 mg 1- 2 tablets one hour before bed  Bronchiectasis  Discussion     05/16/2018  f/u ov/Severina Sykora re: uacs/bronchiectasis with cough x 2013 with h/o bad reflux on fosfamax / pos resp to prednisone  Chief Complaint  Patient presents with  . Follow-up    Cough is about the same. Some nights it keeps her awake all night. She rarely produces sputum but when she does it is clear.   Dyspnea:  Not limited by breathing from desired activities   Cough: never took mucinex dm up to 600 every 12 hours  Sleeping: at bedtime cough 5 min then 2 hours later coughing starts  SABA use: none  02: none  rec Continue the pepcid 20 mg and chlorpheniramine 4 mg x 2 and take one hour before bedtime Gabapentin 100 mg three times a day until return  Add mucinex dm up to 600 mg x 2 every 12 hours  If can't stop coughing  then add tramadol 50 mg one up to every 4 hours  Continue protonix Take 30-60 min before first meal of the day  Prednisone 10 mg take  4 each am x 2 days,   2 each am x 2 days,  1 each am x 2 days and stop  GERD diet  Please schedule a follow up office visit in 4 weeks, call sooner if needed with all medications /inhalers/ solutions in hand        06/14/2018  f/u ov/Caria Transue re: cough/ brought meds  Chief Complaint  Patient presents with  . Follow-up    Cough better during the day, but still bothers her at night. She feels a tickle in her throat. The pred did help.   Dyspnea:  Not limited by breathing from desired activities   Cough: better day but having tickle nightly unless on prednisone Sleeping: elevated  on 6 in blocks SABA use: no   No obvious day to day or daytime variability or assoc excess/ purulent sputum or mucus plugs or hemoptysis or cp or chest tightness, subjective wheeze or overt sinus or hb symptoms.    Also denies any obvious fluctuation of symptoms with weather or environmental changes or other aggravating or alleviating factors except as outlined above   No unusual exposure hx or h/o childhood pna/ asthma or knowledge of premature birth.  Current Allergies, Complete Past Medical History, Past Surgical History, Family History, and Social History were reviewed in [employer] record.  ROS  The following are not active complaints unless bolded Hoarseness, sore throat, dysphagia, dental problems, itching, sneezing,  nasal congestion or discharge of excess mucus or purulent secretions, ear ache,   fever, chills, sweats, unintended wt loss or wt gain, classically pleuritic or exertional cp,  orthopnea pnd or arm/hand swelling  or leg swelling, presyncope, palpitations, abdominal pain, anorexia, nausea, vomiting, diarrhea  or change in bowel habits or change in bladder habits, change in stools or change in urine, dysuria, hematuria,  rash, arthralgias,  visual complaints, headache, numbness, weakness or ataxia or problems with walking or coordination,  change in mood or  memory.        Current Meds  Medication Sig  . acetaminophen (TYLENOL) 325 MG tablet Take 650 mg by mouth every 6 (six) hours as needed.  Marland Kitchen aspirin 81 MG chewable tablet Chew 81 mg by mouth daily.  Marland Kitchen b complex vitamins tablet Take 1 tablet by mouth daily.  . calcium citrate-vitamin D (CITRACAL+D) 315-200 MG-UNIT tablet Take 2 tablets by mouth 2 (two) times daily.  . chlorpheniramine (CHLOR-TRIMETON) 4 MG tablet Take 4 mg by mouth at bedtime. And every 4 hours as needed  . dextromethorphan-guaiFENesin (MUCINEX DM) 30-600 MG 12hr tablet Take 2 tablets by mouth 2 (two) times daily as needed for cough.  . escitalopram (LEXAPRO) 10 MG tablet Take 10 mg by mouth daily.  . famotidine (PEPCID) 20 MG tablet Take 20 mg by mouth at bedtime.  . Garlic (GARLIQUE) 884 MG TBEC Take 1 tablet by mouth daily.  . hydrochlorothiazide (HYDRODIURIL) 25 MG tablet Take 25 mg by mouth daily.  . pantoprazole (PROTONIX) 40 MG tablet TAKE 1 TABLET BY MOUTH DAILY(TAKE 30 TO 60 MINUTES BEFORE FIRST MEAL OF THE DAY)  . pravastatin (PRAVACHOL) 20 MG tablet Take 1 tablet by mouth daily.  . traMADol (ULTRAM) 50 MG tablet 1-2 every 4 hours as needed for cough or pain  . verapamil (COVERA HS) 180 MG (CO) 24 hr tablet Take 180 mg by mouth at bedtime.  . [gabapentin (NEURONTIN) 100 MG capsule Take 1 capsule (100 mg total) by mouth 3 (three) times daily. One three times daily                         Objective:   Physical Exam  amb wf nad   06/14/2018     137  05/16/2018        136  03/13/2018        135  12/12/2017        134  10/17/2017          134  09/19/2017          132   11/26/12 127 lb 3.2 oz (57.698 kg)  10/29/12 130 lb (58.968 kg)  10/01/12 129 lb 9.6 oz (58.786 kg)     Vital signs  reviewed - Note on arrival 02 sats  99% on RA        HEENT: nl dentition, turbinates bilaterally, and  oropharynx which is pristine. Nl external ear canals without cough reflex   NECK :  without JVD/Nodes/TM/ nl carotid upstrokes bilaterally   LUNGS: no acc muscle use,  Nl contour chest which is clear to A and P bilaterally without cough on insp or exp maneuvers   CV:  RRR  no s3 or murmur or increase in P2, and no edema   ABD:  soft and nontender with nl inspiratory excursion in the supine position. No bruits or organomegaly appreciated, bowel sounds nl  MS:  Nl gait/ ext warm without deformities, calf tenderness, cyanosis or clubbing No obvious joint restrictions   SKIN: warm and dry without lesions    NEURO:  alert, approp, nl sensorium with  no motor or cerebellar deficits apparent.        Assessment & Plan:

## 2018-06-14 NOTE — Patient Instructions (Addendum)
Change gabapentin to 100 mg four times daily   Continue to take the chlorpheniramine x 2  X one hour before bed   Please see patient coordinator before you leave today  to schedule methacholine challenge test

## 2018-06-17 ENCOUNTER — Encounter: Payer: Self-pay | Admitting: Internal Medicine

## 2018-06-17 NOTE — Assessment & Plan Note (Signed)
-   sinus CT 11/02/12 > neg - Allergy Profile 10/29/2012 >> IgE  12 pos grass/  Ragweed - Failed singulair > stopped 01/04/2013  > restart monotherapy with chlortrimeton - 10/17/2017 max rx for gerd and 12 days of pred s any cough med or h1 if truly "completely better just while on prednisone"  > pos response short lived  - CT sinus 11/04/17  Subtle opacification over the right frontal ethmoidal recess and anterior right ethmoid air cells. No air-fluid levels. Mastoid air cells are clear - FENO 11/14/2017  =   18 - HRCT 11/29/17 c/w mild bronchiectasis - see bronchiectasis - trial of pepcid and hs h1 03/13/2018 >>> no better -Allergy profile 05/16/18  >  Eos 0.2 /  IgE  11 RAST neg  - add gabapentin 100 mg 05/16/2018   - changed gabapenitn to 100 mg qid 06/14/2018 - Methacholine challenge test 06/17/2018 >>>    The tickle is typical of uacs though the waking up at night and resp to prednisone are both more suggestive of asthmatic component or underlying eos bronchitis/rhinitis so to sort thru ddx rec MCT and in meantime added an hs dose of gabapentin to go with the 2 x h1 at hs

## 2018-06-17 NOTE — Assessment & Plan Note (Signed)
HRCT 11/28/17  1. Scattered mild cylindrical and varicoid bronchiectasis with associated mild tree-in-bud opacities and irregular bandlike and nodular foci of consolidation in both lungs involving the basilar upper lobes, right middle lobe and medial right lower lobe. 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 2. Initial follow-up chest CT advised in 3 months given the dominant 1.1 cm subsolid nodular opacity in the medial right lower lobe, which is probably inflammatory - CT chest 03/06/2018 1. The medial right lower lobe pulmonary nodule of concern has substantially decreased in size, compatible with a resolving inflammatory nodule. 2. Stable mild cylindrical and varicoid bronchiectasis in both lungs, most prominent in the medial right middle lobe and basilar right upper lobe. Associated evolving foci of subpleural postinfectious scarring in the medial lungs.   - PFT's  03/13/2018  FEV1 2.24 (114 % ) ratio 80  p 3 % improvement from saba p nothing prior to study with DLCO  74 % corrects to 84  % for alv volume     No limiting sob or am flares of purulent sputum production so appears well compensated at present> no change in rx    I had an extended discussion with the patient reviewing all relevant studies completed to date and  lasting 15 to 20 minutes of a 25 minute visit    Each maintenance medication was reviewed in detail including most importantly the difference between maintenance and prns and under what circumstances the prns are to be triggered using an action plan format that is not reflected in the computer generated alphabetically organized AVS.     Please see AVS for specific instructions unique to this visit that I personally wrote and verbalized to the the pt in detail and then reviewed with pt  by my nurse highlighting any  changes in therapy recommended at today's visit to their plan of care.

## 2018-06-25 ENCOUNTER — Telehealth: Payer: Self-pay | Admitting: Internal Medicine

## 2018-06-25 NOTE — Telephone Encounter (Signed)
Called and spoke with Patient.  Patient stated that she was scheduled for a methacholine challenge test Wednesday, and she cancelled it, because her copay was 20% of $460, and she can not afford it on a fixed income.  Her next OV is with Dr. Melvyn Novas, 07/16/18.    Will route to Dr. Melvyn Novas

## 2018-06-25 NOTE — Telephone Encounter (Signed)
That's fine, it can wait until ov to regroup and decide whether benefit > cost

## 2018-06-26 NOTE — Telephone Encounter (Signed)
Called and spoke to patient made aware MR states we can address at [place]. Patient okay with this. Voiced understanding, expressed thanks. Nothing further is needed at this time.

## 2018-06-27 ENCOUNTER — Inpatient Hospital Stay (HOSPITAL_COMMUNITY): Admission: RE | Admit: 2018-06-27 | Payer: Medicare Other | Source: Ambulatory Visit

## 2018-07-11 IMAGING — CT CT HEAD W/O CM
3 of 6 series · 15 of 47 positions shown, 18 images · non-contrast
Comparison: None.

CLINICAL DATA: Fall with facial injury while walking up steps.

EXAM:
CT HEAD WITHOUT CONTRAST
CT MAXILLOFACIAL WITHOUT CONTRAST
TECHNIQUE: Multidetector CT imaging of the head and maxillofacial structures
were performed using the standard protocol without intravenous
contrast. Multiplanar CT image reconstructions of the maxillofacial
structures were also generated.

[Series 4: coronal soft tissue · coronal · 0.31mm/px · 2 of 67 slices shown]
[im 22/67  brain]
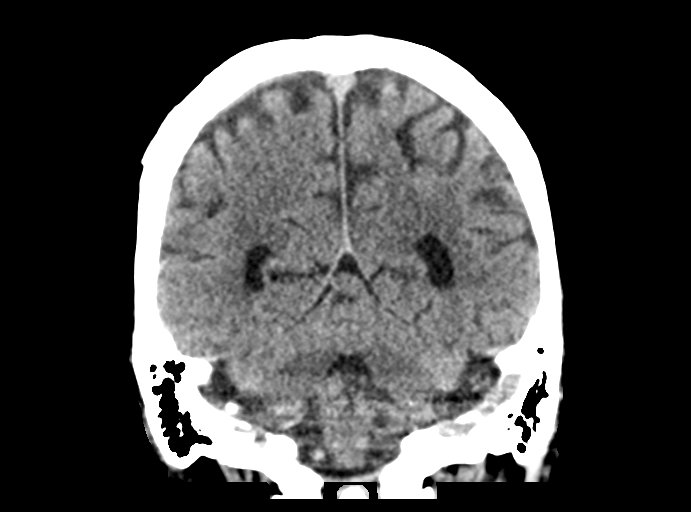
[im 43/67  brain]
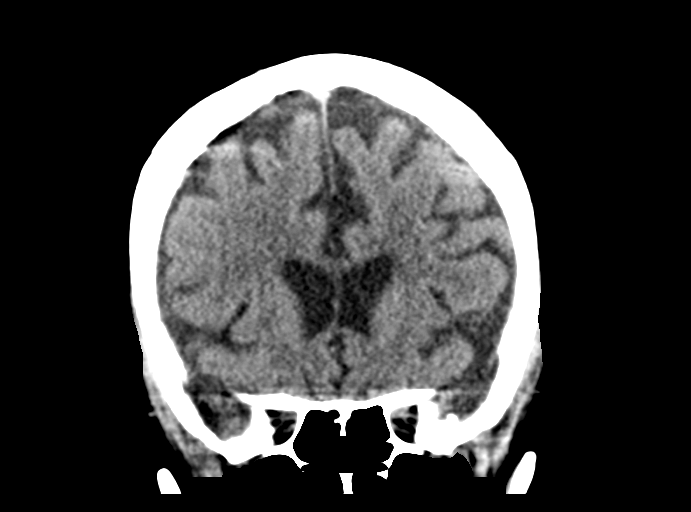

[Series 7: max soft · axial · 0.31mm/px · z∈[+1249,+1393]mm · 11 of 80 slices shown, 14 images]
[im 4/80  brain]
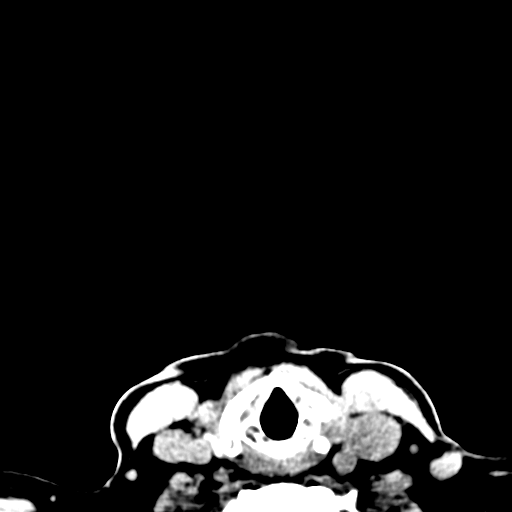
[im 4/80  bone]
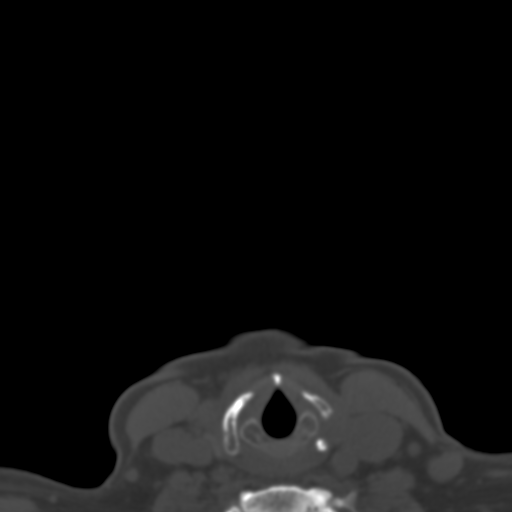
[im 12/80  brain]
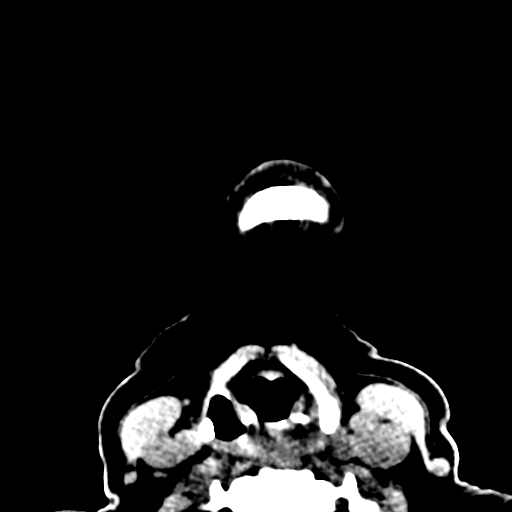
[im 20/80  brain]
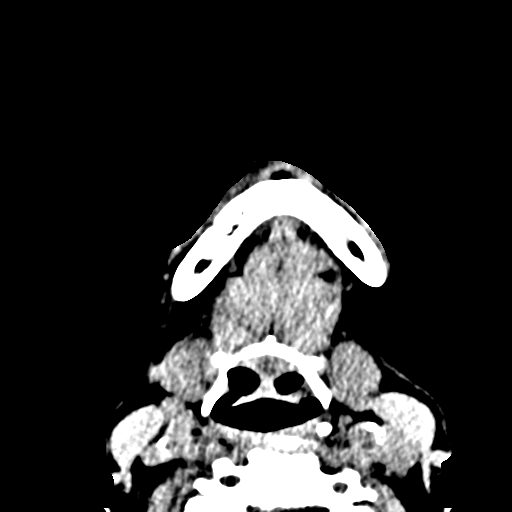
[im 28/80  brain]
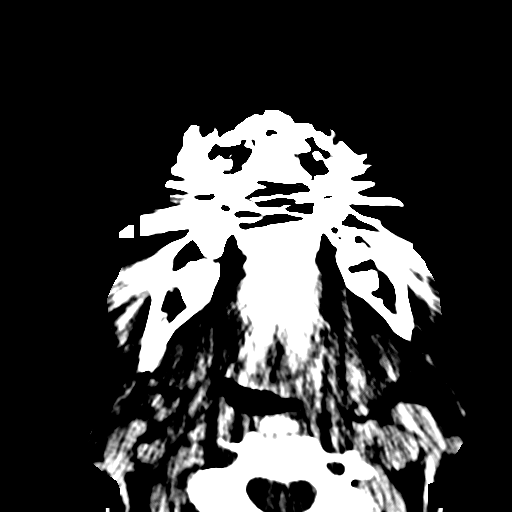
[im 32/80  brain]
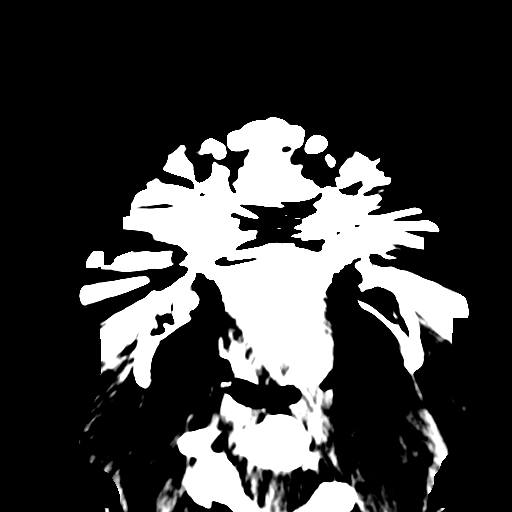
[im 32/80  bone]
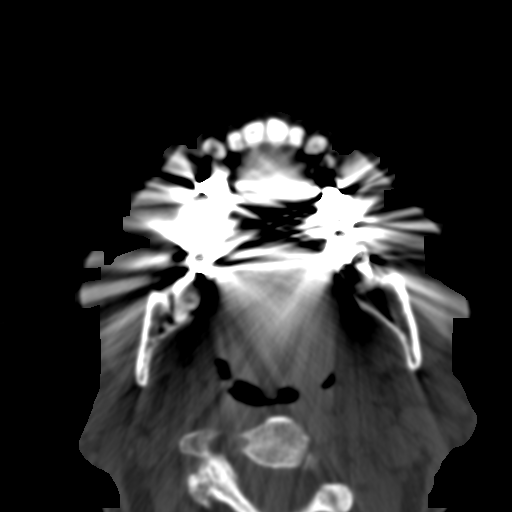
[im 40/80  brain]
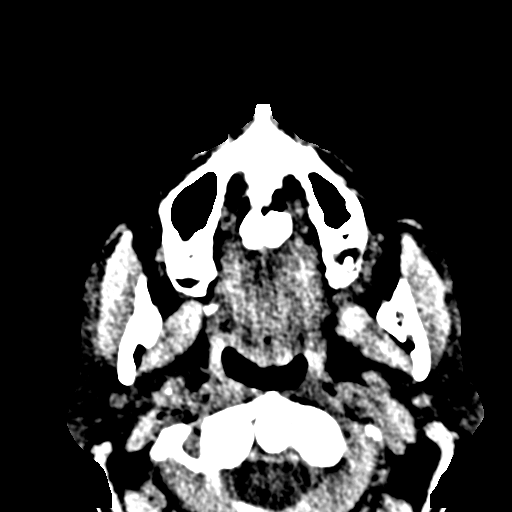
[im 48/80  brain]
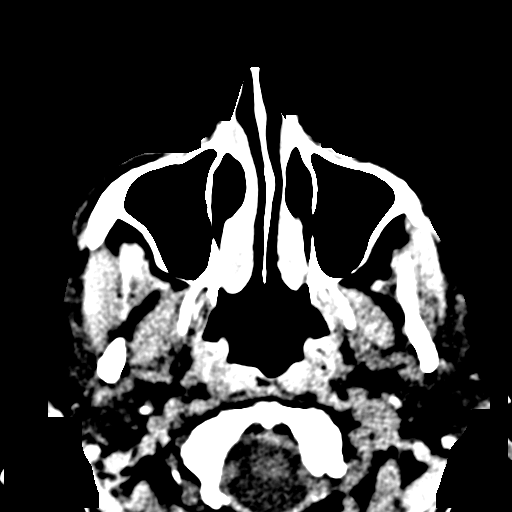
[im 52/80  brain]
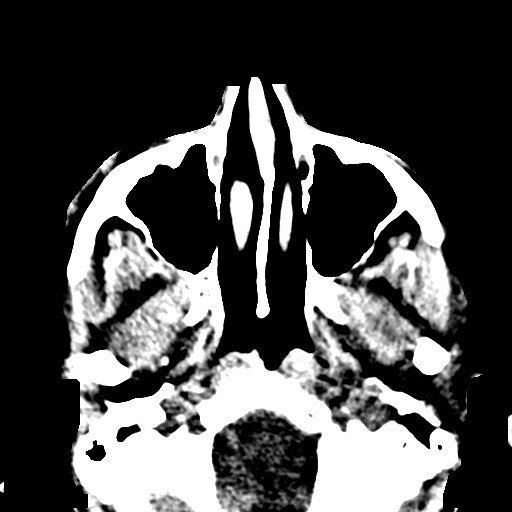
[im 60/80  brain]
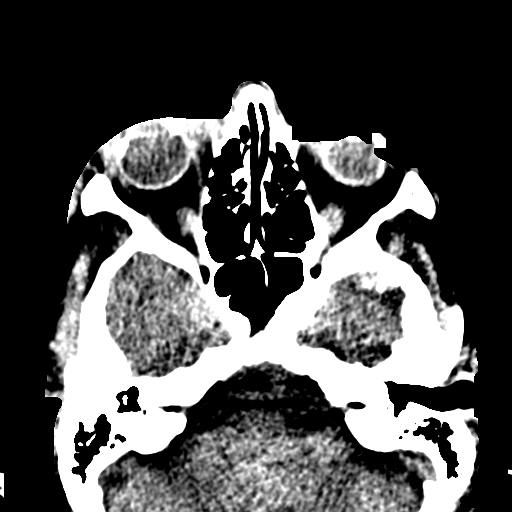
[im 60/80  bone]
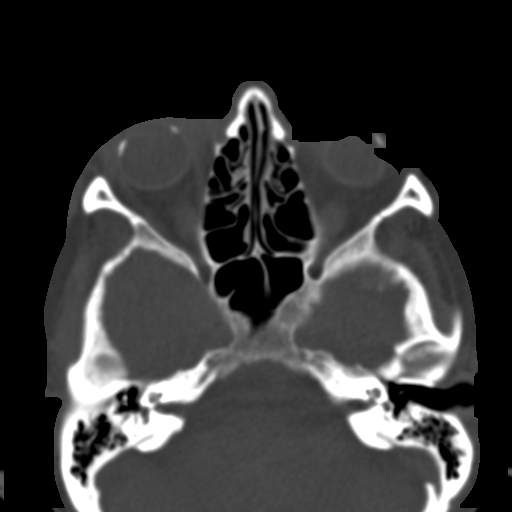
[im 68/80  brain]
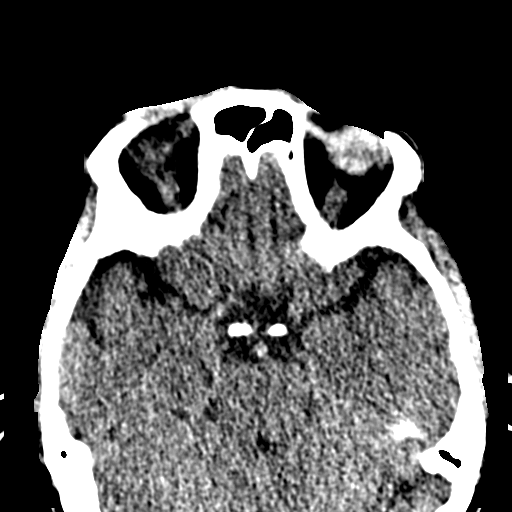
[im 76/80  brain]
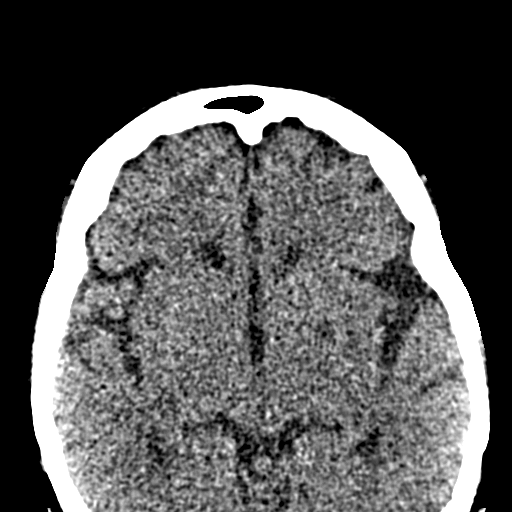

[Series 12: sagittal soft · sagittal · 0.32mm/px · 2 of 82 slices shown]
[im 28/82  brain]
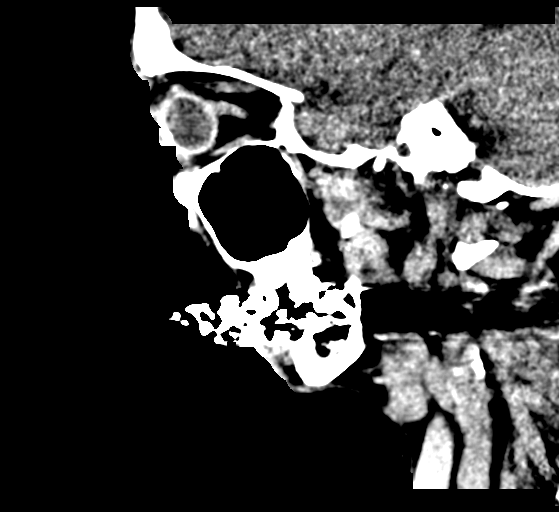
[im 55/82  brain]
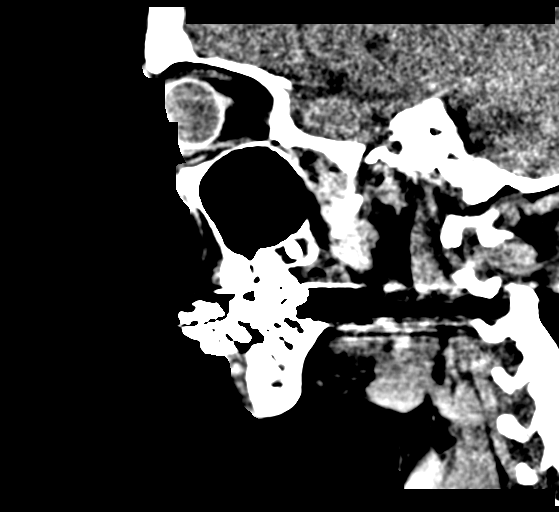

[15 of 47 positions shown; findings below may reference images not displayed]

FINDINGS: CT HEAD FINDINGS

Brain: Ventricles, cisterns and other CSF spaces are within normal.
Two small hypodensities just inferior to the left lentiform nucleus
likely prominent perivascular spaces versus old lacune infarcts. No
mass, mass effect, shift of midline structures or acute hemorrhage.
No evidence of acute infarction.

Vascular: No hyperdense vessel or unexpected calcification.

Skull: Normal. Negative for fracture or focal lesion.

Other: Right periorbital soft tissue swelling.

CT MAXILLOFACIAL FINDINGS

Osseous: No acute facial bone fracture. Degenerate change of the
spine.

Orbits: Mild right periorbital soft tissue swelling. Globes and
retrobulbar spaces are normal and symmetric.

Sinuses: Subtle opacification over the right frontal ethmoidal
recess and anterior right ethmoid air cells. No air-fluid levels.
Mastoid air cells are clear.

Soft tissues: Mild right periorbital soft tissue swelling.Minimal
soft tissue swelling over the anterior right mid face.
IMPRESSION: No acute brain injury.

Two small adjacent prominent perivascular spaces versus old lacunar
infarcts left inferior basal ganglia region.

No acute facial bone fracture.

Soft tissue swelling over the right periorbital region and anterior
right mid face.

## 2018-07-16 ENCOUNTER — Ambulatory Visit: Payer: Medicare Other | Admitting: Internal Medicine

## 2018-09-13 ENCOUNTER — Ambulatory Visit: Payer: Medicare Other | Admitting: Internal Medicine

## 2019-09-12 ENCOUNTER — Ambulatory Visit: Payer: Medicare Other

## 2019-09-19 ENCOUNTER — Ambulatory Visit: Payer: Medicare Other | Attending: Internal Medicine

## 2019-09-19 DIAGNOSIS — Z23 Encounter for immunization: Secondary | ICD-10-CM | POA: Insufficient documentation

## 2019-09-19 NOTE — Progress Notes (Signed)
   Covid-19 Vaccination Clinic  Name:  ANE METHOD    MRN: [id number] DOB: 1933/09/22  09/19/2019  Ms. Schwiesow was observed post Covid-19 immunization for 15 minutes without incidence. She was provided with Vaccine Information Sheet and instruction to access the V-Safe system.   Ms. Scarbro was instructed to call 911 with any severe reactions post vaccine: Marland Kitchen Difficulty breathing  . Swelling of your face and throat  . A fast heartbeat  . A bad rash all over your body  . Dizziness and weakness    Immunizations Administered    Name Date Dose VIS Date Route   Pfizer COVID-19 Vaccine 09/19/2019  9:34 AM 0.3 mL 07/26/2019 Intramuscular   Manufacturer: [place]   Lot: [id number]   [place]: [id number]

## 2019-10-14 ENCOUNTER — Ambulatory Visit: Payer: Medicare Other | Attending: Internal Medicine

## 2019-10-14 DIAGNOSIS — Z23 Encounter for immunization: Secondary | ICD-10-CM | POA: Insufficient documentation

## 2019-10-14 NOTE — Progress Notes (Signed)
   Covid-19 Vaccination Clinic  Name:  YASMENE FUDA    MRN: [id number] DOB: [birth year]  10/14/2019  Ms. Kirouac was observed post Covid-19 immunization for 15 minutes without incidence. She was provided with Vaccine Information Sheet and instruction to access the V-Safe system.   Ms. Gerardot was instructed to call 911 with any severe reactions post vaccine: Marland Kitchen Difficulty breathing  . Swelling of your face and throat  . A fast heartbeat  . A bad rash all over your body  . Dizziness and weakness    Immunizations Administered    Name Date Dose VIS Date Route   Pfizer COVID-19 Vaccine 10/14/2019  1:59 PM 0.3 mL 07/26/2019 Intramuscular   Manufacturer: [place]   Lot: [id number]   [place]: [id number]

## 2020-10-29 DIAGNOSIS — D1801 Hemangioma of skin and subcutaneous tissue: Secondary | ICD-10-CM | POA: Diagnosis not present

## 2020-10-29 DIAGNOSIS — L814 Other melanin hyperpigmentation: Secondary | ICD-10-CM | POA: Diagnosis not present

## 2020-10-29 DIAGNOSIS — L821 Other seborrheic keratosis: Secondary | ICD-10-CM | POA: Diagnosis not present

## 2020-10-29 DIAGNOSIS — L565 Disseminated superficial actinic porokeratosis (DSAP): Secondary | ICD-10-CM | POA: Diagnosis not present

## 2020-12-02 DIAGNOSIS — H40053 Ocular hypertension, bilateral: Secondary | ICD-10-CM | POA: Diagnosis not present

## 2021-02-02 DIAGNOSIS — L570 Actinic keratosis: Secondary | ICD-10-CM | POA: Diagnosis not present

## 2021-05-03 DIAGNOSIS — Z1231 Encounter for screening mammogram for malignant neoplasm of breast: Secondary | ICD-10-CM | POA: Diagnosis not present

## 2021-05-28 DIAGNOSIS — M1990 Unspecified osteoarthritis, unspecified site: Secondary | ICD-10-CM | POA: Diagnosis not present

## 2021-05-28 DIAGNOSIS — J309 Allergic rhinitis, unspecified: Secondary | ICD-10-CM | POA: Diagnosis not present

## 2021-05-28 DIAGNOSIS — Z0000 Encounter for general adult medical examination without abnormal findings: Secondary | ICD-10-CM | POA: Diagnosis not present

## 2021-05-28 DIAGNOSIS — J301 Allergic rhinitis due to pollen: Secondary | ICD-10-CM | POA: Diagnosis not present

## 2021-05-28 DIAGNOSIS — K219 Gastro-esophageal reflux disease without esophagitis: Secondary | ICD-10-CM | POA: Diagnosis not present

## 2021-05-28 DIAGNOSIS — E7800 Pure hypercholesterolemia, unspecified: Secondary | ICD-10-CM | POA: Diagnosis not present

## 2021-05-28 DIAGNOSIS — R051 Acute cough: Secondary | ICD-10-CM | POA: Diagnosis not present

## 2021-05-28 DIAGNOSIS — Z23 Encounter for immunization: Secondary | ICD-10-CM | POA: Diagnosis not present

## 2021-05-28 DIAGNOSIS — I10 Essential (primary) hypertension: Secondary | ICD-10-CM | POA: Diagnosis not present

## 2021-05-28 DIAGNOSIS — M899 Disorder of bone, unspecified: Secondary | ICD-10-CM | POA: Diagnosis not present

## 2021-07-13 DIAGNOSIS — H40023 Open angle with borderline findings, high risk, bilateral: Secondary | ICD-10-CM | POA: Diagnosis not present

## 2021-07-13 DIAGNOSIS — H40053 Ocular hypertension, bilateral: Secondary | ICD-10-CM | POA: Diagnosis not present

## 2021-07-13 DIAGNOSIS — Z961 Presence of intraocular lens: Secondary | ICD-10-CM | POA: Diagnosis not present

## 2021-07-13 DIAGNOSIS — H52203 Unspecified astigmatism, bilateral: Secondary | ICD-10-CM | POA: Diagnosis not present

## 2021-10-21 DIAGNOSIS — S62324A Displaced fracture of shaft of fourth metacarpal bone, right hand, initial encounter for closed fracture: Secondary | ICD-10-CM | POA: Diagnosis not present

## 2021-10-22 DIAGNOSIS — S62354A Nondisplaced fracture of shaft of fourth metacarpal bone, right hand, initial encounter for closed fracture: Secondary | ICD-10-CM | POA: Diagnosis not present

## 2021-11-05 DIAGNOSIS — S62324D Displaced fracture of shaft of fourth metacarpal bone, right hand, subsequent encounter for fracture with routine healing: Secondary | ICD-10-CM | POA: Diagnosis not present

## 2021-11-09 DIAGNOSIS — H40023 Open angle with borderline findings, high risk, bilateral: Secondary | ICD-10-CM | POA: Diagnosis not present

## 2021-11-09 DIAGNOSIS — H40053 Ocular hypertension, bilateral: Secondary | ICD-10-CM | POA: Diagnosis not present

## 2021-11-09 DIAGNOSIS — Z961 Presence of intraocular lens: Secondary | ICD-10-CM | POA: Diagnosis not present

## 2021-11-22 DIAGNOSIS — S62324D Displaced fracture of shaft of fourth metacarpal bone, right hand, subsequent encounter for fracture with routine healing: Secondary | ICD-10-CM | POA: Diagnosis not present

## 2021-11-23 DIAGNOSIS — R059 Cough, unspecified: Secondary | ICD-10-CM | POA: Diagnosis not present

## 2021-11-23 DIAGNOSIS — J069 Acute upper respiratory infection, unspecified: Secondary | ICD-10-CM | POA: Diagnosis not present

## 2021-11-26 DIAGNOSIS — S0093XA Contusion of unspecified part of head, initial encounter: Secondary | ICD-10-CM | POA: Diagnosis not present

## 2021-11-26 DIAGNOSIS — S7000XA Contusion of unspecified hip, initial encounter: Secondary | ICD-10-CM | POA: Diagnosis not present

## 2021-11-26 DIAGNOSIS — R2689 Other abnormalities of gait and mobility: Secondary | ICD-10-CM | POA: Diagnosis not present

## 2022-01-04 DIAGNOSIS — R260 Ataxic gait: Secondary | ICD-10-CM | POA: Diagnosis not present

## 2022-01-12 DIAGNOSIS — R260 Ataxic gait: Secondary | ICD-10-CM | POA: Diagnosis not present

## 2022-01-14 DIAGNOSIS — R260 Ataxic gait: Secondary | ICD-10-CM | POA: Diagnosis not present

## 2022-01-17 DIAGNOSIS — R260 Ataxic gait: Secondary | ICD-10-CM | POA: Diagnosis not present

## 2022-02-07 DIAGNOSIS — R260 Ataxic gait: Secondary | ICD-10-CM | POA: Diagnosis not present

## 2022-02-09 DIAGNOSIS — R260 Ataxic gait: Secondary | ICD-10-CM | POA: Diagnosis not present

## 2022-02-28 DIAGNOSIS — R260 Ataxic gait: Secondary | ICD-10-CM | POA: Diagnosis not present

## 2022-05-16 DIAGNOSIS — Z1231 Encounter for screening mammogram for malignant neoplasm of breast: Secondary | ICD-10-CM | POA: Diagnosis not present

## 2022-06-08 DIAGNOSIS — Z23 Encounter for immunization: Secondary | ICD-10-CM | POA: Diagnosis not present

## 2022-06-08 DIAGNOSIS — M899 Disorder of bone, unspecified: Secondary | ICD-10-CM | POA: Diagnosis not present

## 2022-06-08 DIAGNOSIS — J309 Allergic rhinitis, unspecified: Secondary | ICD-10-CM | POA: Diagnosis not present

## 2022-06-08 DIAGNOSIS — J301 Allergic rhinitis due to pollen: Secondary | ICD-10-CM | POA: Diagnosis not present

## 2022-06-08 DIAGNOSIS — M1990 Unspecified osteoarthritis, unspecified site: Secondary | ICD-10-CM | POA: Diagnosis not present

## 2022-06-08 DIAGNOSIS — R059 Cough, unspecified: Secondary | ICD-10-CM | POA: Diagnosis not present

## 2022-06-08 DIAGNOSIS — I10 Essential (primary) hypertension: Secondary | ICD-10-CM | POA: Diagnosis not present

## 2022-06-08 DIAGNOSIS — K219 Gastro-esophageal reflux disease without esophagitis: Secondary | ICD-10-CM | POA: Diagnosis not present

## 2022-06-08 DIAGNOSIS — Z0000 Encounter for general adult medical examination without abnormal findings: Secondary | ICD-10-CM | POA: Diagnosis not present

## 2022-06-08 DIAGNOSIS — E7800 Pure hypercholesterolemia, unspecified: Secondary | ICD-10-CM | POA: Diagnosis not present

## 2022-07-05 DIAGNOSIS — M85851 Other specified disorders of bone density and structure, right thigh: Secondary | ICD-10-CM | POA: Diagnosis not present

## 2022-07-05 DIAGNOSIS — Z780 Asymptomatic menopausal state: Secondary | ICD-10-CM | POA: Diagnosis not present

## 2022-07-05 DIAGNOSIS — M85852 Other specified disorders of bone density and structure, left thigh: Secondary | ICD-10-CM | POA: Diagnosis not present

## 2022-07-18 DIAGNOSIS — Z961 Presence of intraocular lens: Secondary | ICD-10-CM | POA: Diagnosis not present

## 2022-07-18 DIAGNOSIS — H401131 Primary open-angle glaucoma, bilateral, mild stage: Secondary | ICD-10-CM | POA: Diagnosis not present

## 2022-07-18 DIAGNOSIS — H52203 Unspecified astigmatism, bilateral: Secondary | ICD-10-CM | POA: Diagnosis not present

## 2022-09-22 DIAGNOSIS — R058 Other specified cough: Secondary | ICD-10-CM | POA: Diagnosis not present

## 2022-09-22 DIAGNOSIS — J479 Bronchiectasis, uncomplicated: Secondary | ICD-10-CM | POA: Diagnosis not present

## 2022-11-25 DIAGNOSIS — R194 Change in bowel habit: Secondary | ICD-10-CM | POA: Diagnosis not present

## 2023-01-31 DIAGNOSIS — H401131 Primary open-angle glaucoma, bilateral, mild stage: Secondary | ICD-10-CM | POA: Diagnosis not present

## 2023-05-19 DIAGNOSIS — Z1231 Encounter for screening mammogram for malignant neoplasm of breast: Secondary | ICD-10-CM | POA: Diagnosis not present

## 2023-06-14 DIAGNOSIS — M1990 Unspecified osteoarthritis, unspecified site: Secondary | ICD-10-CM | POA: Diagnosis not present

## 2023-06-14 DIAGNOSIS — I10 Essential (primary) hypertension: Secondary | ICD-10-CM | POA: Diagnosis not present

## 2023-06-14 DIAGNOSIS — Z23 Encounter for immunization: Secondary | ICD-10-CM | POA: Diagnosis not present

## 2023-06-14 DIAGNOSIS — R059 Cough, unspecified: Secondary | ICD-10-CM | POA: Diagnosis not present

## 2023-06-14 DIAGNOSIS — Z0000 Encounter for general adult medical examination without abnormal findings: Secondary | ICD-10-CM | POA: Diagnosis not present

## 2023-06-14 DIAGNOSIS — J309 Allergic rhinitis, unspecified: Secondary | ICD-10-CM | POA: Diagnosis not present

## 2023-06-14 DIAGNOSIS — Z9181 History of falling: Secondary | ICD-10-CM | POA: Diagnosis not present

## 2023-06-14 DIAGNOSIS — J301 Allergic rhinitis due to pollen: Secondary | ICD-10-CM | POA: Diagnosis not present

## 2023-06-14 DIAGNOSIS — K219 Gastro-esophageal reflux disease without esophagitis: Secondary | ICD-10-CM | POA: Diagnosis not present

## 2023-06-14 DIAGNOSIS — E7800 Pure hypercholesterolemia, unspecified: Secondary | ICD-10-CM | POA: Diagnosis not present

## 2023-06-14 DIAGNOSIS — M899 Disorder of bone, unspecified: Secondary | ICD-10-CM | POA: Diagnosis not present

## 2023-08-04 DIAGNOSIS — H40053 Ocular hypertension, bilateral: Secondary | ICD-10-CM | POA: Diagnosis not present

## 2023-08-04 DIAGNOSIS — H40013 Open angle with borderline findings, low risk, bilateral: Secondary | ICD-10-CM | POA: Diagnosis not present

## 2023-08-04 DIAGNOSIS — H26491 Other secondary cataract, right eye: Secondary | ICD-10-CM | POA: Diagnosis not present

## 2023-08-04 DIAGNOSIS — H5203 Hypermetropia, bilateral: Secondary | ICD-10-CM | POA: Diagnosis not present

## 2023-08-04 DIAGNOSIS — Z961 Presence of intraocular lens: Secondary | ICD-10-CM | POA: Diagnosis not present

## 2023-08-26 DIAGNOSIS — M79642 Pain in left hand: Secondary | ICD-10-CM | POA: Diagnosis not present

## 2023-08-29 DIAGNOSIS — S62617A Displaced fracture of proximal phalanx of left little finger, initial encounter for closed fracture: Secondary | ICD-10-CM | POA: Diagnosis not present

## 2023-09-12 DIAGNOSIS — S62617D Displaced fracture of proximal phalanx of left little finger, subsequent encounter for fracture with routine healing: Secondary | ICD-10-CM | POA: Diagnosis not present

## 2023-09-13 DIAGNOSIS — H26491 Other secondary cataract, right eye: Secondary | ICD-10-CM | POA: Diagnosis not present

## 2023-10-12 DIAGNOSIS — S62617D Displaced fracture of proximal phalanx of left little finger, subsequent encounter for fracture with routine healing: Secondary | ICD-10-CM | POA: Diagnosis not present

## 2024-01-13 DIAGNOSIS — E7800 Pure hypercholesterolemia, unspecified: Secondary | ICD-10-CM | POA: Diagnosis not present

## 2024-01-13 DIAGNOSIS — I10 Essential (primary) hypertension: Secondary | ICD-10-CM | POA: Diagnosis not present

## 2024-01-13 DIAGNOSIS — J479 Bronchiectasis, uncomplicated: Secondary | ICD-10-CM | POA: Diagnosis not present

## 2024-01-13 DIAGNOSIS — M1990 Unspecified osteoarthritis, unspecified site: Secondary | ICD-10-CM | POA: Diagnosis not present

## 2024-02-12 DIAGNOSIS — M1990 Unspecified osteoarthritis, unspecified site: Secondary | ICD-10-CM | POA: Diagnosis not present

## 2024-02-12 DIAGNOSIS — I10 Essential (primary) hypertension: Secondary | ICD-10-CM | POA: Diagnosis not present

## 2024-02-12 DIAGNOSIS — J479 Bronchiectasis, uncomplicated: Secondary | ICD-10-CM | POA: Diagnosis not present

## 2024-02-12 DIAGNOSIS — E7800 Pure hypercholesterolemia, unspecified: Secondary | ICD-10-CM | POA: Diagnosis not present

## 2024-03-02 DIAGNOSIS — B029 Zoster without complications: Secondary | ICD-10-CM | POA: Diagnosis not present

## 2024-03-14 DIAGNOSIS — J479 Bronchiectasis, uncomplicated: Secondary | ICD-10-CM | POA: Diagnosis not present

## 2024-03-14 DIAGNOSIS — E7800 Pure hypercholesterolemia, unspecified: Secondary | ICD-10-CM | POA: Diagnosis not present

## 2024-03-14 DIAGNOSIS — I10 Essential (primary) hypertension: Secondary | ICD-10-CM | POA: Diagnosis not present

## 2024-03-14 DIAGNOSIS — M1990 Unspecified osteoarthritis, unspecified site: Secondary | ICD-10-CM | POA: Diagnosis not present

## 2024-03-25 DIAGNOSIS — H40053 Ocular hypertension, bilateral: Secondary | ICD-10-CM | POA: Diagnosis not present

## 2024-03-25 DIAGNOSIS — H40013 Open angle with borderline findings, low risk, bilateral: Secondary | ICD-10-CM | POA: Diagnosis not present

## 2024-04-14 DIAGNOSIS — M1990 Unspecified osteoarthritis, unspecified site: Secondary | ICD-10-CM | POA: Diagnosis not present

## 2024-04-14 DIAGNOSIS — J479 Bronchiectasis, uncomplicated: Secondary | ICD-10-CM | POA: Diagnosis not present

## 2024-04-14 DIAGNOSIS — E7800 Pure hypercholesterolemia, unspecified: Secondary | ICD-10-CM | POA: Diagnosis not present

## 2024-04-14 DIAGNOSIS — I10 Essential (primary) hypertension: Secondary | ICD-10-CM | POA: Diagnosis not present

## 2024-05-14 DIAGNOSIS — J479 Bronchiectasis, uncomplicated: Secondary | ICD-10-CM | POA: Diagnosis not present

## 2024-05-14 DIAGNOSIS — I10 Essential (primary) hypertension: Secondary | ICD-10-CM | POA: Diagnosis not present

## 2024-05-14 DIAGNOSIS — M1990 Unspecified osteoarthritis, unspecified site: Secondary | ICD-10-CM | POA: Diagnosis not present

## 2024-05-14 DIAGNOSIS — E7800 Pure hypercholesterolemia, unspecified: Secondary | ICD-10-CM | POA: Diagnosis not present

## 2024-05-21 DIAGNOSIS — Z1231 Encounter for screening mammogram for malignant neoplasm of breast: Secondary | ICD-10-CM | POA: Diagnosis not present

## 2024-06-13 DIAGNOSIS — I10 Essential (primary) hypertension: Secondary | ICD-10-CM | POA: Diagnosis not present

## 2024-06-13 DIAGNOSIS — E7800 Pure hypercholesterolemia, unspecified: Secondary | ICD-10-CM | POA: Diagnosis not present
